# Patient Record
Sex: Female | Born: 2005 | Race: White | Hispanic: Yes | Marital: Single | Smoking: Never smoker
Health system: Southern US, Community
[De-identification: ages and names within clinical notes are randomized; demographics above are authoritative.]

## PROBLEM LIST (undated history)

## (undated) ENCOUNTER — Inpatient Hospital Stay (HOSPITAL_COMMUNITY): Payer: Self-pay

## (undated) DIAGNOSIS — F32A Depression, unspecified: Secondary | ICD-10-CM

## (undated) DIAGNOSIS — Z889 Allergy status to unspecified drugs, medicaments and biological substances status: Secondary | ICD-10-CM

## (undated) DIAGNOSIS — F909 Attention-deficit hyperactivity disorder, unspecified type: Secondary | ICD-10-CM

## (undated) HISTORY — DX: Allergy status to unspecified drugs, medicaments and biological substances: Z88.9

## (undated) HISTORY — DX: Attention-deficit hyperactivity disorder, unspecified type: F90.9

---
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
## 2023-01-17 ENCOUNTER — Encounter: Payer: Self-pay | Admitting: Family

## 2023-01-28 ENCOUNTER — Telehealth (HOSPITAL_COMMUNITY): Payer: 59 | Admitting: Psychiatry

## 2023-01-28 ENCOUNTER — Encounter (HOSPITAL_COMMUNITY): Payer: Self-pay

## 2023-01-28 ENCOUNTER — Other Ambulatory Visit (HOSPITAL_BASED_OUTPATIENT_CLINIC_OR_DEPARTMENT_OTHER): Payer: Self-pay

## 2023-02-06 ENCOUNTER — Other Ambulatory Visit (HOSPITAL_BASED_OUTPATIENT_CLINIC_OR_DEPARTMENT_OTHER): Payer: Self-pay

## 2023-02-06 ENCOUNTER — Other Ambulatory Visit (HOSPITAL_COMMUNITY): Payer: Self-pay | Admitting: Psychiatry

## 2023-02-06 MED ORDER — VENLAFAXINE HCL ER 75 MG PO CP24
75.0000 mg | ORAL_CAPSULE | Freq: Every day | ORAL | 0 refills | Status: DC
  Filled 2023-02-06 – 2023-02-09 (×2): qty 90, 90d supply, fill #0

## 2023-02-09 ENCOUNTER — Other Ambulatory Visit (HOSPITAL_COMMUNITY): Payer: Self-pay

## 2023-02-11 ENCOUNTER — Other Ambulatory Visit (HOSPITAL_BASED_OUTPATIENT_CLINIC_OR_DEPARTMENT_OTHER): Payer: Self-pay

## 2023-02-26 ENCOUNTER — Other Ambulatory Visit (HOSPITAL_BASED_OUTPATIENT_CLINIC_OR_DEPARTMENT_OTHER): Payer: Self-pay

## 2023-02-26 ENCOUNTER — Encounter: Payer: Self-pay | Admitting: Dermatology

## 2023-02-26 ENCOUNTER — Ambulatory Visit (INDEPENDENT_AMBULATORY_CARE_PROVIDER_SITE_OTHER): Payer: 59 | Admitting: Dermatology

## 2023-02-26 DIAGNOSIS — L905 Scar conditions and fibrosis of skin: Secondary | ICD-10-CM

## 2023-02-26 DIAGNOSIS — L700 Acne vulgaris: Secondary | ICD-10-CM | POA: Diagnosis not present

## 2023-02-26 MED ORDER — TRETINOIN 0.025 % EX CREA
1.0000 | TOPICAL_CREAM | CUTANEOUS | 1 refills | Status: DC
  Filled 2023-02-26: qty 45, 30d supply, fill #0
  Filled 2023-03-27 – 2023-04-17 (×2): qty 45, 30d supply, fill #1

## 2023-02-26 MED ORDER — CLINDAMYCIN PHOSPHATE 1 % EX SWAB
1.0000 | Freq: Every day | CUTANEOUS | 1 refills | Status: DC
  Filled 2023-02-26: qty 60, 60d supply, fill #0

## 2023-02-26 MED ORDER — SPIRONOLACTONE 100 MG PO TABS
100.0000 mg | ORAL_TABLET | Freq: Every day | ORAL | 2 refills | Status: DC
  Filled 2023-02-26: qty 30, 30d supply, fill #0
  Filled 2023-03-27 – 2023-04-17 (×2): qty 30, 30d supply, fill #1

## 2023-02-26 NOTE — Patient Instructions (Addendum)
Hello Renee Miller,  Thank you for visiting my office today. Your dedication to enhancing your skin health is greatly appreciated. Below is a summary of the treatment plan we outlined during your consultation:  - Medications Prescribed:   - Spironolactone: For hormonal acne control.   - Tretinoin 0.025% Cream: Apply a pea-sized amount at night on Monday, Wednesday, and Friday. Should you experience dryness or irritation, reduce application to two nights a week.   - Clindamycin Pledgets: Use on affected areas as needed.  - Skincare Routine Adjustments:    - Morning Routine: Begin by washing with [employer] from [employer], followed by applying La Roche-Posay Toleriane Double Repair Moisturizer.    - Night Routine: Wash with the same cleanser, then apply a hyaluronic acid serum, followed by Tretinoin and a heavier cream if necessary. On nights when you're not using Tretinoin, just apply hyaluronic acid and moisturizer.   - Product Discontinuation: Stop using your current salicylic acid products to avoid dryness.  - Additional Recommendations:   - Eye Hydration: Consider  Kiehl's Avocado Eye Cream.   - Morning Cleansing: Ensure to wash your face every morning to remove overnight products, especially before sun exposure.  - Follow-Up:   - Monitoring: Keep an eye on your skin's response to the new regimen. Improvements should be noticeable every four weeks, with significant improvement expected within three months.   - Next Steps: If results are not as anticipated, we may consider Accutane as a subsequent treatment option.  - Educational Materials:   - Samples of the recommended wash and moisturizer have been provided.  Please initiate this new regimen and keep me informed of your progress through MyChart. Should you have any questions or concerns, please do not hesitate to reach out.  Warm regards,  Dr. Langston Reusing [facility] Important Information  Due to recent changes in healthcare  laws, you may see results of your pathology and/or laboratory studies on MyChart before the doctors have had a chance to review them. We understand that in some cases there may be results that are confusing or concerning to you. Please understand that not all results are received at the same time and often the doctors may need to interpret multiple results in order to provide you with the best plan of care or course of treatment. Therefore, we ask that you please give [place] 2 business days to thoroughly review all your results before contacting the office for clarification. Should we see a critical lab result, you will be contacted sooner.   If You Need Anything After Your Visit  If you have any questions or concerns for your doctor, please call our main line at [phone] If no one answers, please leave a voicemail as directed and we will return your call as soon as possible. Messages left after 4 pm will be answered the following business day.   You may also send [place] a message via MyChart. We typically respond to MyChart messages within 1-2 business days.  For prescription refills, please ask your pharmacy to contact our office. Our fax number is [phone].  If you have an urgent issue when the clinic is closed that cannot wait until the next business day, you can page your doctor at the number below.    Please note that while we do our best to be available for urgent issues outside of office hours, we are not available 24/7.   If you have an urgent issue and are unable to reach [place], you may  choose to seek medical care at your doctor's office, retail clinic, urgent care center, or emergency room.  If you have a medical emergency, please immediately call 911 or go to the emergency department. In the event of inclement weather, please call our main line at [phone] for an update on the status of any delays or closures.  Dermatology Medication Tips: Please keep the boxes that topical  medications come in in order to help keep track of the instructions about where and how to use these. Pharmacies typically print the medication instructions only on the boxes and not directly on the medication tubes.   If your medication is too expensive, please contact our office at [phone] or send [place] a message through MyChart.   We are unable to tell what your co-pay for medications will be in advance as this is different depending on your insurance coverage. However, we may be able to find a substitute medication at lower cost or fill out paperwork to get insurance to cover a needed medication.   If a prior authorization is required to get your medication covered by your insurance company, please allow [place] 1-2 business days to complete this process.  Drug prices often vary depending on where the prescription is filled and some pharmacies may offer cheaper prices.  The website www.goodrx.com contains coupons for medications through different pharmacies. The prices here do not account for what the cost may be with help from insurance (it may be cheaper with your insurance), but the website can give you the price if you did not use any insurance.  - You can print the associated coupon and take it with your prescription to the pharmacy.  - You may also stop by our office during regular business hours and pick up a GoodRx coupon card.  - If you need your prescription sent electronically to a different pharmacy, notify our office through [facility] or by phone at [phone]

## 2023-02-26 NOTE — Progress Notes (Unsigned)
   New Patient Visit   Subjective  Renee Miller is a 17 y.o. female who presents for the following: acne  Patient states she has acne located at the face that she would like to have examined. Patient reports the areas have been there for 1 years. She reports the areas are not bothersome.Patient rates irritation 0 out of 10. She states that the areas have not spread. Patient reports she has previously been treated for these areas by PCP and was Rx Minocycline and Clindamycin gel but she did not see a difference after 73mo of use as the Rx's made her skin irritated with a burning sensation. Patient denies Hx of bx. Patient denies family history of skin cancer(s).   The following portions of the chart were reviewed this encounter and updated as appropriate: medications, allergies, medical history  Review of Systems:  No other skin or systemic complaints except as noted in HPI or Assessment and Plan.  Objective  Well appearing patient in no apparent distress; mood and affect are within normal limits.   A focused examination was performed of the following areas: face   Relevant exam findings are noted in the Assessment and Plan.          Assessment & Plan   ACNE VULGARIS Exam: Open comedones and inflammatory papules  flared  Treatment Plan: - D/C OTC The Ordinary & Panoxyl as it is too drying at this time - Rx Spirolactone 100mg  - take once daily - to stop hormone receptors - Recommended La Roche Posay foaming hydrating wash. Samples provided. - Morning Routine - Rx Clindamycin swabs - use every morning - then follow up with La Roche Posay facial double moisturizer.  - Night Routine - Rx Tretinoin 0.025% cream - use nightly on M, W, F. Pt recommended to gague dryness to know to decrease to 2 days a week if needed.   Acne Scars Exam: erythematous, ice pick scars on cheeks I counseled the patient regarding the following: Skin care: Acne scars can be treated with lasers to  decrease the redness and laser re surfacing, dermabrasion, and chemical peels to improve the appearance of scarring. Expectations: Acne scarring are permanent s. cars caused by intense inflammation, rupture and picking of acne lesions. The appearance of acne scarring improves on its own for the first 1-2 years. Contact office if: Acne scars worsen or fail to improve despite months of treatment; patient develops new scars, significantly more nodules or cysts.  Counseling: -Explained to patient that acnes scars can improve with topical retinoids -We will continue to increase strength of topical retinoids over time as tolerated or transition to oral isotretinoin -Once acne is under control we will discuss laser or microneedling as long term treatment options.     Acne vulgaris  Related Medications spironolactone (ALDACTONE) 100 MG tablet Take 1 tablet (100 mg total) by mouth daily.  tretinoin (RETIN-A) 0.025 % cream Apply every Monday, Wednesday, and Friday at bedtime.  clindamycin (CLEOCIN T) 1 % SWAB Apply 1 Application topically daily.    Return in about 3 months (around 05/29/2023) for acne.    Documentation: I have reviewed the above documentation for accuracy and completeness, and I agree with the above.   I, Shirron Marcha Solders, CMA, am acting as scribe for [employer], DO.   Langston Reusing, DO

## 2023-02-27 ENCOUNTER — Encounter: Payer: Self-pay | Admitting: Dermatology

## 2023-02-27 NOTE — Telephone Encounter (Signed)
She should until she's been on her new acne regimen for 2 months before try any new beauty products.

## 2023-03-08 ENCOUNTER — Other Ambulatory Visit (HOSPITAL_BASED_OUTPATIENT_CLINIC_OR_DEPARTMENT_OTHER): Payer: Self-pay

## 2023-03-15 ENCOUNTER — Ambulatory Visit: Payer: 59 | Admitting: Family

## 2023-03-28 ENCOUNTER — Other Ambulatory Visit: Payer: Self-pay

## 2023-03-28 ENCOUNTER — Other Ambulatory Visit (HOSPITAL_BASED_OUTPATIENT_CLINIC_OR_DEPARTMENT_OTHER): Payer: Self-pay

## 2023-04-08 ENCOUNTER — Other Ambulatory Visit (HOSPITAL_BASED_OUTPATIENT_CLINIC_OR_DEPARTMENT_OTHER): Payer: Self-pay

## 2023-04-15 ENCOUNTER — Ambulatory Visit (INDEPENDENT_AMBULATORY_CARE_PROVIDER_SITE_OTHER): Payer: 59 | Admitting: Family

## 2023-04-15 VITALS — BP 122/80 | HR 77 | Temp 97.5°F | Resp 16 | Ht 62.5 in | Wt 114.0 lb

## 2023-04-15 DIAGNOSIS — L709 Acne, unspecified: Secondary | ICD-10-CM | POA: Diagnosis not present

## 2023-04-15 DIAGNOSIS — N643 Galactorrhea not associated with childbirth: Secondary | ICD-10-CM | POA: Diagnosis not present

## 2023-04-15 DIAGNOSIS — R634 Abnormal weight loss: Secondary | ICD-10-CM

## 2023-04-15 DIAGNOSIS — Z30011 Encounter for initial prescription of contraceptive pills: Secondary | ICD-10-CM

## 2023-04-15 HISTORY — DX: Abnormal weight loss: R63.4

## 2023-04-15 LAB — BASIC METABOLIC PANEL
BUN: 11 mg/dL (ref 6–23)
CO2: 26 meq/L (ref 19–32)
Calcium: 10.1 mg/dL (ref 8.4–10.5)
Chloride: 102 meq/L (ref 96–112)
Creatinine, Ser: 0.73 mg/dL (ref 0.40–1.20)
GFR: 119.32 mL/min (ref >60.00)
Glucose, Bld: 81 mg/dL (ref 70–99)
Potassium: 4.2 meq/L (ref 3.5–5.1)
Sodium: 137 meq/L (ref 135–145)

## 2023-04-15 LAB — POCT URINE PREGNANCY: Preg Test, Ur: NEGATIVE

## 2023-04-15 LAB — TSH: TSH: 2.88 u[IU]/mL (ref 0.40–5.00)

## 2023-04-15 NOTE — Patient Instructions (Signed)
 VISIT SUMMARY:  During today's visit, we discussed your recent experience with breast leakage and irritability, which you believe may be due to hormonal changes. We also reviewed your difficulties with birth control adherence and your current acne treatment.  YOUR PLAN:  -GALACTORRHEA: Galactorrhea is a condition where there is unexpected milk production from the breasts. We have ordered a prolactin level, thyroid  testing and kidney function testing to investigate further, and your breast exam showed no abnormal findings.  -BIRTH CONTROL: We understand that you have had difficulty adhering to oral contraceptive pills and experienced adverse reactions to the contraceptive patch. We recommend considering long-acting reversible contraceptives like Nexplanon  or an IUD, and you should discuss these options with your gynecologist at your appointment next week.  -ACNE: Your acne is currently being managed with Aldactone , and we advise you to continue with your current treatment regimen with your dermatologist.  INSTRUCTIONS:  Please follow up with your gynecologist next week to discuss alternative contraceptive options.

## 2023-04-15 NOTE — Assessment & Plan Note (Signed)
  New onset bilateral breast leakage. No current use of medications known to cause galactorrhea. Negative pregnancy test. -Order prolactin level, TSH, renal function -Performed breast exam, no abnormal findings.

## 2023-04-15 NOTE — Assessment & Plan Note (Signed)
 Reports that weight loss has been intentional with healthy diet and more regular exercise.  I advised pt to try to maintain current weight and not to lose additional weight.

## 2023-04-15 NOTE — Addendum Note (Signed)
 Addended by: Wilford Corner on: 04/15/2023 12:28 PM   Modules accepted: Orders

## 2023-04-15 NOTE — Assessment & Plan Note (Signed)
  Difficulty with adherence to oral contraceptive pill. Previous adverse reaction to contraceptive patch. -Recommended considering long-acting reversible contraceptives such as Nexplanon  or IUD. -Advised to discuss options with gynecologist at upcoming appointment next week.

## 2023-04-15 NOTE — Progress Notes (Signed)
 Subjective:     Patient ID: Renee Miller, female    DOB: Sep 08, 2005, 18 y.o.   MRN: [id number]  Chief Complaint  Patient presents with   Breast Problem    Leaking from both breast    HPI  Discussed the use of AI scribe software for clinical note transcription with the patient, who gave verbal consent to proceed.  History of Present Illness   The patient, with a history of breast leakage secondary to risperidone , presents with a recurrence of the symptom. Renee Miller notes that when Renee Miller stopped the risperdone the breast leakage stopped.  Renee Miller most recently noted recurrence of the leakage 'three days ago' when Renee Miller 'pushed down on' her breasts. Renee Miller also reports irritability, which Renee Miller attributes to hormonal changes. Renee Miller denies current use of birth control pills due to difficulty with daily adherence and a previous adverse reaction to the patch, which caused increased appetite and weight gain. Renee Miller is scheduled to see a gynecologist next week to discuss alternative contraceptive options. Renee Miller is also on Aldactone  for acne.      Wt Readings from Last 3 Encounters:  04/15/23 114 lb (51.7 kg) (23%, Z= -0.72)*  11/06/22 116 lb (52.6 kg) (29%, Z= -0.55)*  08/14/22 122 lb (55.3 kg) (43%, Z= -0.17)*   * Growth percentiles are based on CDC (Girls, 2-20 Years) data.       Health Maintenance Due  Topic Date Due   HIV Screening  Never done   Hepatitis C Screening  Never done   INFLUENZA VACCINE  11/08/2022   COVID-19 Vaccine (4 - 2024-25 season) 12/09/2022    Past Medical History:  Diagnosis Date   ADHD    s/p evaluation with Dr. Garnette Altabet 11/21   Depression    H/O seasonal allergies     Past Surgical History:  Procedure Laterality Date   ADENOIDECTOMY, TONSILLECTOMY AND MYRINGOTOMY WITH TUBE PLACEMENT Bilateral 2010   EUSTACHIAN TUBE DILATION Left 2017    Family History  Problem Relation Age of Onset   Hypertension Mother    Healthy Father    Depression Maternal  Grandmother    Drug abuse Maternal Grandmother    Alcohol abuse Maternal Grandmother    Drug abuse Maternal Grandfather    Hypertension Maternal Grandfather    Hyperlipidemia Maternal Grandfather    Heart disease Maternal Grandfather    Alcohol abuse Maternal Grandfather    Diabetes Paternal Grandmother     Social History   Socioeconomic History   Marital status: Single    Spouse name: Not on file   Number of children: Not on file   Years of education: Not on file   Highest education level: 12th grade  Occupational History   Occupation: consulting civil engineer  Tobacco Use   Smoking status: Never   Smokeless tobacco: Never  Vaping Use   Vaping status: Never Used  Substance and Sexual Activity   Alcohol use: Yes   Drug use: Yes   Sexual activity: Never  Other Topics Concern   Not on file  Social History Narrative   Lives with her mom   Enjoys singing and soccer.   Grandparents in in [place]   Has extended family in [place]   Renee Miller is in the 7th grade   Renee Miller attends [employer].   Social Drivers of Corporate Investment Banker Strain: Low Risk  (11/05/2022)   Overall Financial Resource Strain (CARDIA)    Difficulty of Paying Living Expenses: Not hard at all  Food  Insecurity: No Food Insecurity (11/05/2022)   Hunger Vital Sign    Worried About Running Out of Food in the Last Year: Never true    Ran Out of Food in the Last Year: Never true  Transportation Needs: No Transportation Needs (11/05/2022)   PRAPARE - Administrator, Civil Service (Medical): No    Lack of Transportation (Non-Medical): No  Physical Activity: Sufficiently Active (11/05/2022)   Exercise Vital Sign    Days of Exercise per Week: 6 days    Minutes of Exercise per Session: 30 min  Stress: Stress Concern Present (11/05/2022)   [employer] of Occupational Health - Occupational Stress Questionnaire    Feeling of Stress : Rather much  Social Connections: Socially Isolated (11/05/2022)   Social  Connection and Isolation Panel [NHANES]    Frequency of Communication with Friends and Family: Twice a week    Frequency of Social Gatherings with Friends and Family: More than three times a week    Attends Religious Services: Never    Database Administrator or Organizations: No    Attends Engineer, Structural: Not on file    Marital Status: Never married  Intimate Partner Violence: Not At Risk (06/06/2020)   Humiliation, Afraid, Rape, and Kick questionnaire    Fear of Current or Ex-Partner: No    Emotionally Abused: No    Physically Abused: No    Sexually Abused: No    Outpatient Medications Prior to Visit  Medication Sig Dispense Refill   clindamycin  (CLEOCIN  T) 1 % SWAB Apply 1 Application topically daily. 60 each 1   LORazepam  (ATIVAN ) 0.5 MG tablet Take 1 tablet (0.5 mg total) by mouth daily as needed for anxiety. 30 tablet 0   spironolactone  (ALDACTONE ) 100 MG tablet Take 1 tablet (100 mg total) by mouth daily. 30 tablet 2   tretinoin  (RETIN-A ) 0.025 % cream Apply every Monday, Wednesday, and Friday at bedtime. 45 g 1   venlafaxine  XR (EFFEXOR  XR) 75 MG 24 hr capsule Take 1 capsule (75 mg total) by mouth daily with breakfast. 90 capsule 0   norethindrone -ethinyl estradiol -iron (MICROGESTIN  FE 1.5/30) 1.5-30 MG-MCG tablet Take 1 tablet by mouth daily. (Patient not taking: Reported on 04/15/2023) 28 tablet 11   No facility-administered medications prior to visit.    Allergies  Allergen Reactions   Amoxicillin Hives    ROS    See HPI Objective:    Physical Exam Constitutional:      Appearance: Normal appearance.  HENT:     Head: Normocephalic and atraumatic.  Eyes:     General: No scleral icterus. Chest:     Comments: Dense cystic breast tissue bilaterally without discrete masses noted  Pt demonstrated ability to express milky white discharge from right breast Abdominal:     General: Abdomen is flat. There is no distension.  Musculoskeletal:        General:  No swelling.  Skin:    Comments: Some acne noted on bilateral cheeks  Neurological:     Mental Status: Renee Miller is alert and oriented to person, place, and time.  Psychiatric:        Mood and Affect: Mood normal.        Behavior: Behavior normal.        Thought Content: Thought content normal.        Judgment: Judgment normal.      BP 122/80 (BP Location: Left Arm, Patient Position: Sitting, Cuff Size: Small)   Pulse 77  Temp (!) 97.5 F (36.4 C) (Oral)   Resp 16   Ht 5' 2.5 (1.588 m)   Wt 114 lb (51.7 kg)   LMP 03/15/2023   SpO2 98%   BMI 20.52 kg/m  Wt Readings from Last 3 Encounters:  04/15/23 114 lb (51.7 kg) (23%, Z= -0.72)*  11/06/22 116 lb (52.6 kg) (29%, Z= -0.55)*  08/14/22 122 lb (55.3 kg) (43%, Z= -0.17)*   * Growth percentiles are based on CDC (Girls, 2-20 Years) data.       Assessment & Plan:   Problem List Items Addressed This Visit       Unprioritized   Weight loss   Reports that weight loss has been intentional with healthy diet and more regular exercise.  I advised pt to try to maintain current weight and not to lose additional weight.       Galactorrhea - Primary    New onset bilateral breast leakage. No current use of medications known to cause galactorrhea. Negative pregnancy test. -Order prolactin level, TSH, renal function -Performed breast exam, no abnormal findings.       Relevant Orders   Prolactin   TSH   Basic Metabolic Panel (BMET)   Contraceptive management    Difficulty with adherence to oral contraceptive pill. Previous adverse reaction to contraceptive patch. -Recommended considering long-acting reversible contraceptives such as Nexplanon  or IUD. -Advised to discuss options with gynecologist at upcoming appointment next week.      Acne    Currently managed with Aldactone , clindamycin  wipes and tretinoin  cream. -Continue current regimen per dermatology       I have discontinued Hong F. Cartier Abhi's  norethindrone -ethinyl estradiol -iron. I am also having her maintain her LORazepam , venlafaxine  XR, spironolactone , tretinoin , and clindamycin .  No orders of the defined types were placed in this encounter.

## 2023-04-15 NOTE — Assessment & Plan Note (Addendum)
  Currently managed with Aldactone, clindamycin wipes and tretinoin cream. -Continue current regimen per dermatology

## 2023-04-16 LAB — PROLACTIN: Prolactin: 19.9 ng/mL

## 2023-04-17 ENCOUNTER — Other Ambulatory Visit (HOSPITAL_BASED_OUTPATIENT_CLINIC_OR_DEPARTMENT_OTHER): Payer: Self-pay

## 2023-04-17 ENCOUNTER — Other Ambulatory Visit: Payer: Self-pay

## 2023-04-18 ENCOUNTER — Other Ambulatory Visit: Payer: Self-pay

## 2023-04-19 ENCOUNTER — Telehealth: Payer: Self-pay | Admitting: Family

## 2023-04-19 NOTE — Telephone Encounter (Signed)
 See mychart.

## 2023-04-19 NOTE — Telephone Encounter (Signed)
-----   Message from Delon Lenis sent at 04/18/2023  4:55 PM EST ----- Hi Dymon Summerhill,  Thank you for the update on Abhi.  Yes,  I think it's a wise next step to stop the Aldactone  to see if the galactorrhea resolves.  It could very well be the culprit.  There are many other treatment modalities we can use to keep her acne under control.  Thanks!  Best,  Dr. Lenis ----- Message ----- From: Daryl Setter, NP Sent: 04/18/2023  10:28 AM EST To: Delon LOISE Lenis, DO  Dear Dr. Lenis,  I wanted to reach out about our mutual patient Abhi.  In the past she had an episode of elevated prolactin level with galactorrhea that occurred while taking Risperdal .  Risperdal  was discontinued and her galactorrhea resolved.    She presented this week with recurrent galactorrhea, but prolactin level was normal.  I am thinking that aldactone  may be the cause.  Would you be ok with us  holding the aldactone  to see if her galactorrhea resolves?   Thanks,  Lataysha Vohra

## 2023-04-21 ENCOUNTER — Encounter: Payer: Self-pay | Admitting: Family

## 2023-04-21 ENCOUNTER — Encounter (HOSPITAL_COMMUNITY): Payer: Self-pay

## 2023-04-25 DIAGNOSIS — M9902 Segmental and somatic dysfunction of thoracic region: Secondary | ICD-10-CM | POA: Diagnosis not present

## 2023-04-25 DIAGNOSIS — M542 Cervicalgia: Secondary | ICD-10-CM | POA: Diagnosis not present

## 2023-04-25 DIAGNOSIS — M9904 Segmental and somatic dysfunction of sacral region: Secondary | ICD-10-CM | POA: Diagnosis not present

## 2023-04-25 DIAGNOSIS — M9901 Segmental and somatic dysfunction of cervical region: Secondary | ICD-10-CM | POA: Diagnosis not present

## 2023-04-25 DIAGNOSIS — M5451 Vertebrogenic low back pain: Secondary | ICD-10-CM | POA: Diagnosis not present

## 2023-04-25 DIAGNOSIS — M9903 Segmental and somatic dysfunction of lumbar region: Secondary | ICD-10-CM | POA: Diagnosis not present

## 2023-04-25 DIAGNOSIS — M546 Pain in thoracic spine: Secondary | ICD-10-CM | POA: Diagnosis not present

## 2023-04-30 ENCOUNTER — Encounter (HOSPITAL_COMMUNITY): Payer: Self-pay | Admitting: Psychiatry

## 2023-04-30 ENCOUNTER — Telehealth (HOSPITAL_COMMUNITY): Payer: Commercial Managed Care - PPO | Admitting: Psychiatry

## 2023-04-30 ENCOUNTER — Other Ambulatory Visit (HOSPITAL_BASED_OUTPATIENT_CLINIC_OR_DEPARTMENT_OTHER): Payer: Self-pay

## 2023-04-30 DIAGNOSIS — F331 Major depressive disorder, recurrent, moderate: Secondary | ICD-10-CM

## 2023-04-30 DIAGNOSIS — F4310 Post-traumatic stress disorder, unspecified: Secondary | ICD-10-CM

## 2023-04-30 DIAGNOSIS — F410 Panic disorder [episodic paroxysmal anxiety] without agoraphobia: Secondary | ICD-10-CM

## 2023-04-30 DIAGNOSIS — F411 Generalized anxiety disorder: Secondary | ICD-10-CM

## 2023-04-30 MED ORDER — VENLAFAXINE HCL ER 75 MG PO CP24
75.0000 mg | ORAL_CAPSULE | Freq: Every day | ORAL | 0 refills | Status: DC
  Filled 2023-04-30: qty 90, 90d supply, fill #0

## 2023-04-30 MED ORDER — VENLAFAXINE HCL ER 37.5 MG PO CP24
37.5000 mg | ORAL_CAPSULE | Freq: Every day | ORAL | 0 refills | Status: DC
  Filled 2023-04-30: qty 30, 30d supply, fill #0
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
## 2023-05-03 ENCOUNTER — Ambulatory Visit: Payer: Commercial Managed Care - PPO | Admitting: Family

## 2023-05-03 ENCOUNTER — Ambulatory Visit (INDEPENDENT_AMBULATORY_CARE_PROVIDER_SITE_OTHER): Payer: Commercial Managed Care - PPO | Admitting: Family

## 2023-05-03 ENCOUNTER — Other Ambulatory Visit (HOSPITAL_BASED_OUTPATIENT_CLINIC_OR_DEPARTMENT_OTHER): Payer: Self-pay

## 2023-05-03 VITALS — BP 134/85 | HR 104 | Temp 97.7°F | Resp 16 | Ht 62.5 in | Wt 111.0 lb

## 2023-05-03 DIAGNOSIS — Z30019 Encounter for initial prescription of contraceptives, unspecified: Secondary | ICD-10-CM | POA: Diagnosis not present

## 2023-05-03 DIAGNOSIS — Z20828 Contact with and (suspected) exposure to other viral communicable diseases: Secondary | ICD-10-CM

## 2023-05-03 DIAGNOSIS — N643 Galactorrhea not associated with childbirth: Secondary | ICD-10-CM

## 2023-05-03 DIAGNOSIS — F419 Anxiety disorder, unspecified: Secondary | ICD-10-CM | POA: Diagnosis not present

## 2023-05-03 DIAGNOSIS — L709 Acne, unspecified: Secondary | ICD-10-CM

## 2023-05-03 DIAGNOSIS — J3489 Other specified disorders of nose and nasal sinuses: Secondary | ICD-10-CM | POA: Diagnosis not present

## 2023-05-03 LAB — POC INFLUENZA A&B (BINAX/QUICKVUE)
Influenza A, POC: NEGATIVE
Influenza B, POC: NEGATIVE

## 2023-05-03 LAB — POCT URINE PREGNANCY: Preg Test, Ur: NEGATIVE

## 2023-05-03 MED ORDER — HYDROXYZINE PAMOATE 25 MG PO CAPS
25.0000 mg | ORAL_CAPSULE | Freq: Three times a day (TID) | ORAL | 0 refills | Status: DC | PRN
  Filled 2023-05-03: qty 30, 10d supply, fill #0

## 2023-05-03 MED ORDER — NORETHIN ACE-ETH ESTRAD-FE 1.5-30 MG-MCG PO TABS
1.0000 | ORAL_TABLET | Freq: Every day | ORAL | 3 refills | Status: DC
  Filled 2023-05-03: qty 84, 84d supply, fill #0

## 2023-05-03 NOTE — Assessment & Plan Note (Addendum)
Uncontrolled. Effexor recently increased by her psychiatrist. She is also having issues with insomnia. Will add back hydroxyzine prn anxiety and insomnia.

## 2023-05-03 NOTE — Assessment & Plan Note (Signed)
Reports that this has resolved following discontinuation of aldactone.

## 2023-05-03 NOTE — Assessment & Plan Note (Addendum)
Urine HCG negative. Restart OCP.  She wishes to return during her spring break for Nexplanon insertion.

## 2023-05-03 NOTE — Assessment & Plan Note (Signed)
had flu exposure, family is worried that she has the flu. Requested flu swab today which is negative.

## 2023-05-03 NOTE — Assessment & Plan Note (Signed)
Notes that acne has worsened off of hydroxyzine.  However, once she is back on birth control, she states the plan is for her dermatologist to begin Accutane.

## 2023-05-03 NOTE — Patient Instructions (Signed)
VISIT SUMMARY:  During today's visit, we discussed your ongoing issues with anxiety and insomnia, which have worsened since starting Effexor. You also expressed concerns about restarting oral contraceptives and managing your acne. Additionally, we addressed your potential exposure to influenza.  YOUR PLAN:  -ANXIETY AND INSOMNIA: Anxiety and insomnia are conditions that can cause excessive worry and difficulty sleeping. Since starting Effexor, your symptoms have worsened. We will continue with the current Effexor dose as prescribed by your psychiatrist and add Hydroxyzine as needed to help with anxiety and sleep.  -BIRTH CONTROL: You wish to restart oral contraceptives, which are medications used to prevent pregnancy. We will restart Microgestin and send refills. We will also consider Nexplanon placement during your spring break in March.  -ACNE: Acne is a skin condition that occurs when hair follicles become clogged with oil and dead skin cells. You wish to start Accutane once you are back on birth control. For now, continue with your current acne treatment, and we will consider starting Accutane once you are on birth control.  -INFLUENZA EXPOSURE: Influenza, commonly known as the flu, is a viral infection that affects the respiratory system. Due to your potential exposure in your dorm, we will perform an influenza swab to rule out infection.  INSTRUCTIONS:  Please follow up with your psychiatrist regarding the Effexor dosage. Continue taking Hydroxyzine as needed for anxiety and sleep. Restart Microgestin as discussed, and consider Nexplanon placement during spring break. Continue your current acne treatment and we will discuss starting Accutane once you are on birth control. We will perform an influenza swab today to rule out infection.

## 2023-05-03 NOTE — Progress Notes (Signed)
Subjective:     Patient ID: Renee Miller, female    DOB: 2006/03/06, 18 y.o.   MRN: [id number]  Chief Complaint  Patient presents with   Contraception    Here for contraception management    Anxiety    Reports increased anxiety symptoms, on effexor 75    Insomnia    Complains of trouble falling and staying sleep     Discussed the use of AI scribe software for clinical note transcription with the patient, who gave verbal consent to proceed.  History of Present Illness   The patient, with a history of anxiety and insomnia, presents with worsening symptoms since starting Effexor. She reports trouble sleeping, weight loss, decreased appetite, and memory issues. These symptoms have been particularly challenging for her in her academic setting. Despite expressing these concerns to her psychiatrist, the dose of Effexor was increased, which the patient does not believe will be beneficial. She has previously tried multiple medications for her symptoms, including hydroxyzine and buspirone, with varying degrees of success.  In addition to her mental health concerns, the patient is also seeking to restart oral contraceptives. She had previously been on microgestin and spironolactone, the latter of which was suspected to be causing lactation. Since stopping these medications, the patient has noticed worsening skin but improved emotional stability and cessation of lactation. She is considering starting Accutane for her skin once she is back on birth control.      LMP 1/6     Health Maintenance Due  Topic Date Due   HIV Screening  Never done   Hepatitis C Screening  Never done   INFLUENZA VACCINE  11/08/2022   COVID-19 Vaccine (4 - 2024-25 season) 12/09/2022    Past Medical History:  Diagnosis Date   ADHD    s/p evaluation with Dr. Jeannett Senior Altabet 11/21   Depression    H/O seasonal allergies     Past Surgical History:  Procedure Laterality Date   ADENOIDECTOMY, TONSILLECTOMY  AND MYRINGOTOMY WITH TUBE PLACEMENT Bilateral 2010   EUSTACHIAN TUBE DILATION Left 2017    Family History  Problem Relation Age of Onset   Hypertension Mother    Healthy Father    Depression Maternal Grandmother    Drug abuse Maternal Grandmother    Alcohol abuse Maternal Grandmother    Drug abuse Maternal Grandfather    Hypertension Maternal Grandfather    Hyperlipidemia Maternal Grandfather    Heart disease Maternal Grandfather    Alcohol abuse Maternal Grandfather    Diabetes Paternal Grandmother     Social History   Socioeconomic History   Marital status: Single    Spouse name: Not on file   Number of children: Not on file   Years of education: Not on file   Highest education level: 12th grade  Occupational History   Occupation: Consulting civil engineer  Tobacco Use   Smoking status: Never   Smokeless tobacco: Never  Vaping Use   Vaping status: Never Used  Substance and Sexual Activity   Alcohol use: Yes   Drug use: Yes   Sexual activity: Never  Other Topics Concern   Not on file  Social History Narrative   Lives with her mom   Enjoys singing and soccer.   Grandparents in in [place]   Has extended family in [place]   She is in the 7th grade   She attends [employer].   Social Drivers of Health   Financial Resource Strain: Low Risk  (11/05/2022)   Overall  Financial Resource Strain (CARDIA)    Difficulty of Paying Living Expenses: Not hard at all  Food Insecurity: No Food Insecurity (11/05/2022)   Hunger Vital Sign    Worried About Running Out of Food in the Last Year: Never true    Ran Out of Food in the Last Year: Never true  Transportation Needs: No Transportation Needs (11/05/2022)   PRAPARE - Administrator, Civil Service (Medical): No    Lack of Transportation (Non-Medical): No  Physical Activity: Sufficiently Active (11/05/2022)   Exercise Vital Sign    Days of Exercise per Week: 6 days    Minutes of Exercise per Session: 30 min  Stress: Stress  Concern Present (11/05/2022)   [employer] of Occupational Health - Occupational Stress Questionnaire    Feeling of Stress : Rather much  Social Connections: Socially Isolated (11/05/2022)   Social Connection and Isolation Panel [NHANES]    Frequency of Communication with Friends and Family: Twice a week    Frequency of Social Gatherings with Friends and Family: More than three times a week    Attends Religious Services: Never    Database administrator or Organizations: No    Attends Engineer, structural: Not on file    Marital Status: Never married  Intimate Partner Violence: Not At Risk (06/06/2020)   Humiliation, Afraid, Rape, and Kick questionnaire    Fear of Current or Ex-Partner: No    Emotionally Abused: No    Physically Abused: No    Sexually Abused: No    Outpatient Medications Prior to Visit  Medication Sig Dispense Refill   clindamycin (CLEOCIN T) 1 % SWAB Apply 1 Application topically daily. 60 each 1   LORazepam (ATIVAN) 0.5 MG tablet Take 1 tablet (0.5 mg total) by mouth daily as needed for anxiety. 30 tablet 0   spironolactone (ALDACTONE) 100 MG tablet Take 1 tablet (100 mg total) by mouth daily. 30 tablet 2   tretinoin (RETIN-A) 0.025 % cream Apply every Monday, Wednesday, and Friday at bedtime. 45 g 1   venlafaxine XR (EFFEXOR XR) 37.5 MG 24 hr capsule Take 1 capsule (37.5 mg total) by mouth daily with breakfast. Take with 75 mg capsule for a total dose of 112.5 mg daily. 30 capsule 0   venlafaxine XR (EFFEXOR XR) 75 MG 24 hr capsule Take 1 capsule (75 mg total) by mouth daily with breakfast. Take with 37.5 mg capsule for a total dose of 112.5 mg daily. 90 capsule 0   No facility-administered medications prior to visit.    Allergies  Allergen Reactions   Amoxicillin Hives    Review of Systems  Psychiatric/Behavioral:  The patient has insomnia.    See HPI    Objective:    Physical Exam Constitutional:      General: She is not in acute  distress.    Appearance: Normal appearance. She is well-developed.  HENT:     Head: Normocephalic and atraumatic.     Right Ear: Tympanic membrane, ear canal and external ear normal.     Left Ear: Tympanic membrane, ear canal and external ear normal.     Mouth/Throat:     Mouth: Mucous membranes are moist.     Pharynx: No pharyngeal swelling or oropharyngeal exudate.  Eyes:     General: No scleral icterus. Neck:     Thyroid: No thyromegaly.  Cardiovascular:     Rate and Rhythm: Normal rate and regular rhythm.     Heart sounds: Normal  heart sounds. No murmur heard. Pulmonary:     Effort: Pulmonary effort is normal. No respiratory distress.     Breath sounds: Normal breath sounds. No wheezing.  Musculoskeletal:        General: No swelling.     Cervical back: Neck supple.  Skin:    General: Skin is warm and dry.  Neurological:     Mental Status: She is alert and oriented to person, place, and time.  Psychiatric:        Mood and Affect: Mood normal.        Behavior: Behavior normal.        Thought Content: Thought content normal.        Judgment: Judgment normal.      BP 134/85 (BP Location: Left Arm, Patient Position: Sitting, Cuff Size: Small)   Pulse (!) 104   Temp 97.7 F (36.5 C) (Oral)   Resp 16   Ht 5' 2.5" (1.588 m)   Wt 111 lb (50.3 kg)   LMP 03/15/2023   SpO2 100%   BMI 19.98 kg/m  Wt Readings from Last 3 Encounters:  05/03/23 111 lb (50.3 kg) (18%, Z= -0.93)*  04/15/23 114 lb (51.7 kg) (23%, Z= -0.72)*  11/06/22 116 lb (52.6 kg) (29%, Z= -0.55)*   * Growth percentiles are based on CDC (Girls, 2-20 Years) data.       Assessment & Plan:   Problem List Items Addressed This Visit       Unprioritized   Rhinorrhea   had flu exposure, family is worried that she has the flu. Requested flu swab today which is negative.       Galactorrhea   Reports that this has resolved following discontinuation of aldactone.       Contraceptive management - Primary    Urine HCG negative. Restart OCP.  She wishes to return during her spring break for Nexplanon insertion.       Relevant Orders   POCT urine pregnancy (Completed)   Anxiety   Uncontrolled. Effexor recently increased by her psychiatrist. She is also having issues with insomnia. Will add back hydroxyzine prn anxiety and insomnia.       Relevant Medications   hydrOXYzine (VISTARIL) 25 MG capsule   Acne   Notes that acne has worsened off of hydroxyzine.  However, once she is back on birth control, she states the plan is for her dermatologist to begin Accutane.       Relevant Medications   norethindrone-ethinyl estradiol-iron (MICROGESTIN FE 1.5/30) 1.5-30 MG-MCG tablet   Other Visit Diagnoses       Exposure to influenza       Relevant Orders   POC Influenza A&B (Binax test) (Completed)       I am having Alexianna F. Earnhardt "Abhi" start on hydrOXYzine and norethindrone-ethinyl estradiol-iron. I am also having her maintain her LORazepam, spironolactone, tretinoin, clindamycin, venlafaxine XR, and venlafaxine XR.  Meds ordered this encounter  Medications   hydrOXYzine (VISTARIL) 25 MG capsule    Sig: Take 1 capsule (25 mg total) by mouth every 8 (eight) hours as needed (anxiety or insomnia).    Dispense:  30 capsule    Refill:  0    Supervising Provider:   Danise Edge A [[id number]]   norethindrone-ethinyl estradiol-iron (MICROGESTIN FE 1.5/30) 1.5-30 MG-MCG tablet    Sig: Take 1 tablet by mouth daily.    Dispense:  84 tablet    Refill:  3    Supervising Provider:   Danise Edge  A [[id number]]

## 2023-05-06 ENCOUNTER — Other Ambulatory Visit (HOSPITAL_BASED_OUTPATIENT_CLINIC_OR_DEPARTMENT_OTHER): Payer: Self-pay

## 2023-05-06 ENCOUNTER — Other Ambulatory Visit: Payer: Self-pay

## 2023-05-06 ENCOUNTER — Ambulatory Visit
Admission: RE | Admit: 2023-05-06 | Discharge: 2023-05-06 | Disposition: A | Discharge status: Home / Self Care | Payer: Commercial Managed Care - PPO | Source: Ambulatory Visit | Attending: Internal Medicine | Admitting: Internal Medicine

## 2023-05-06 VITALS — BP 131/77 | HR 94 | Temp 98.6°F | Resp 16

## 2023-05-06 DIAGNOSIS — J101 Influenza due to other identified influenza virus with other respiratory manifestations: Secondary | ICD-10-CM | POA: Diagnosis not present

## 2023-05-06 LAB — POC COVID19/FLU A&B COMBO
Covid Antigen, POC: NEGATIVE
Influenza A Antigen, POC: POSITIVE — AB
Influenza B Antigen, POC: NEGATIVE

## 2023-05-06 MED ORDER — OSELTAMIVIR PHOSPHATE 75 MG PO CAPS
75.0000 mg | ORAL_CAPSULE | Freq: Two times a day (BID) | ORAL | 0 refills | Status: DC
  Filled 2023-05-06: qty 10, 5d supply, fill #0

## 2023-05-06 MED ORDER — PROMETHAZINE-DM 6.25-15 MG/5ML PO SYRP
5.0000 mL | ORAL_SOLUTION | Freq: Three times a day (TID) | ORAL | 0 refills | Status: DC | PRN
  Filled 2023-05-06: qty 180, 12d supply, fill #0

## 2023-05-06 NOTE — ED Triage Notes (Signed)
Both suitemates tested positive for flu. On Friday got tested for flu which was negative. Later Friday started to develop cough and fever. Has taken dayquil, nyquil, zycam, ibuprofen, tylenol.

## 2023-05-06 NOTE — Discharge Instructions (Addendum)
Flu A is positive. Flu B and Covid are negative. This is a virus and does not require antibiotics. We can treat with the following:  Tamiflu 75 mg twice daily for 5 days. This medication helps to reduce the symptoms and duration of the flu Promethazine DM 5 mL every 8 hours as needed for cough.  Use caution as this medication can cause drowsiness. Rest and stay hydrated.  Avoid close interaction with others while you are running a fever Return to urgent care or PCP if symptoms worsen or fail to resolve.

## 2023-05-06 NOTE — ED Provider Notes (Signed)
[facility]    CSN: [id number] Arrival date & time: 05/06/23  1451      History   Chief Complaint Chief Complaint  Patient presents with   Fever    Direct exposure to flu last week with roommate at [employer]. Need a note for classes. - Entered by patient    HPI Renee Miller is a 18 y.o. female.   18 year old female who presents to urgent care with complaints of cough, fevers and recent exposure to influenza.  She reports that at college her suite mates were diagnosed with flu.  She began having symptoms on Saturday morning but tested negative at that time for the flu.  Her symptoms have continued to worsen especially the cough and slightly sore throat.  She has had fevers as well.  She denies shortness of breath, chest pain, nausea, vomiting, chills, ear pain.   Fever Associated symptoms: cough and sore throat   Associated symptoms: no chest pain, no chills, no dysuria, no ear pain, no rash and no vomiting     Past Medical History:  Diagnosis Date   ADHD    s/p evaluation with Dr. Jeannett Senior Altabet 11/21   Depression    H/O seasonal allergies     Patient Active Problem List   Diagnosis Date Noted   Rhinorrhea 05/03/2023   Weight loss 04/15/2023   Acne 08/14/2022   Galactorrhea 08/14/2022   Irregular menstrual bleeding 08/16/2021   Contraceptive management 09/07/2020   Abnormal breast tissue 09/07/2020   Severe episode of recurrent major depressive disorder, without psychotic features (HCC) 07/31/2020   MDD (major depressive disorder), recurrent episode, severe (HCC) 07/31/2020   PTSD (post-traumatic stress disorder) 06/06/2020   Panic attacks 06/06/2020   Major depressive disorder, recurrent episode, moderate (HCC) 06/06/2020   Generalized anxiety disorder 06/06/2020   Moderate episode of recurrent major depressive disorder (HCC) 06/06/2020   Anxiety 02/27/2019    Past Surgical History:  Procedure Laterality Date   ADENOIDECTOMY, TONSILLECTOMY AND  MYRINGOTOMY WITH TUBE PLACEMENT Bilateral 2010   EUSTACHIAN TUBE DILATION Left 2017    OB History   No obstetric history on file.      Home Medications    Prior to Admission medications   Medication Sig Start Date End Date Taking? Authorizing Provider  oseltamivir (TAMIFLU) 75 MG capsule Take 1 capsule (75 mg total) by mouth every 12 (twelve) hours. 05/06/23  Yes Jalina Blowers A, PA-C  promethazine-dextromethorphan (PROMETHAZINE-DM) 6.25-15 MG/5ML syrup Take 5 mLs by mouth every 8 (eight) hours as needed for cough. 05/06/23  Yes Efstathios Sawin A, PA-C  clindamycin (CLEOCIN T) 1 % SWAB Apply 1 Application topically daily. 02/26/23   Terri Piedra, DO  hydrOXYzine (VISTARIL) 25 MG capsule Take 1 capsule (25 mg total) by mouth every 8 (eight) hours as needed (anxiety or insomnia). 05/03/23   Sandford Craze, NP  LORazepam (ATIVAN) 0.5 MG tablet Take 1 tablet (0.5 mg total) by mouth daily as needed for anxiety. 11/01/22   Thresa Ross, MD  norethindrone-ethinyl estradiol-iron (MICROGESTIN FE 1.5/30) 1.5-30 MG-MCG tablet Take 1 tablet by mouth daily. 05/03/23   Sandford Craze, NP  spironolactone (ALDACTONE) 100 MG tablet Take 1 tablet (100 mg total) by mouth daily. 02/26/23   Terri Piedra, DO  tretinoin (RETIN-A) 0.025 % cream Apply every Monday, Wednesday, and Friday at bedtime. 02/27/23 02/27/24  Terri Piedra, DO  venlafaxine XR (EFFEXOR XR) 37.5 MG 24 hr capsule Take 1 capsule (37.5 mg total) by mouth daily with  breakfast. Take with 75 mg capsule for a total dose of 112.5 mg daily. 04/30/23   Thresa Ross, MD  venlafaxine XR (EFFEXOR XR) 75 MG 24 hr capsule Take 1 capsule (75 mg total) by mouth daily with breakfast. Take with 37.5 mg capsule for a total dose of 112.5 mg daily. 04/30/23   Thresa Ross, MD    Family History Family History  Problem Relation Age of Onset   Hypertension Mother    Healthy Father    Depression Maternal Grandmother    Drug abuse  Maternal Grandmother    Alcohol abuse Maternal Grandmother    Drug abuse Maternal Grandfather    Hypertension Maternal Grandfather    Hyperlipidemia Maternal Grandfather    Heart disease Maternal Grandfather    Alcohol abuse Maternal Grandfather    Diabetes Paternal Grandmother     Social History Social History   Tobacco Use   Smoking status: Never   Smokeless tobacco: Never  Vaping Use   Vaping status: Never Used  Substance Use Topics   Alcohol use: Yes   Drug use: Yes     Allergies   Amoxicillin   Review of Systems Review of Systems  Constitutional:  Positive for fever. Negative for chills.  HENT:  Positive for sore throat. Negative for ear pain.   Eyes:  Negative for pain and visual disturbance.  Respiratory:  Positive for cough. Negative for shortness of breath.   Cardiovascular:  Negative for chest pain and palpitations.  Gastrointestinal:  Negative for abdominal pain and vomiting.  Genitourinary:  Negative for dysuria and hematuria.  Musculoskeletal:  Negative for arthralgias and back pain.  Skin:  Negative for color change and rash.  Neurological:  Negative for seizures and syncope.  All other systems reviewed and are negative.    Physical Exam Triage Vital Signs ED Triage Vitals  Encounter Vitals Group     BP 05/06/23 1507 131/77     Systolic BP Percentile --      Diastolic BP Percentile --      Pulse Rate 05/06/23 1507 94     Resp 05/06/23 1507 16     Temp 05/06/23 1507 98.6 F (37 C)     Temp src --      SpO2 05/06/23 1507 97 %     Weight --      Height --      Head Circumference --      Peak Flow --      Pain Score 05/06/23 1511 5     Pain Loc --      Pain Education --      Exclude from Growth Chart --    No data found.  Updated Vital Signs BP 131/77   Pulse 94   Temp 98.6 F (37 C)   Resp 16   LMP 04/15/2023 (Approximate)   SpO2 97%   Visual Acuity Right Eye Distance:   Left Eye Distance:   Bilateral Distance:    Right Eye  Near:   Left Eye Near:    Bilateral Near:     Physical Exam Vitals and nursing note reviewed.  Constitutional:      General: She is not in acute distress.    Appearance: She is well-developed.  HENT:     Head: Normocephalic and atraumatic.  Eyes:     Conjunctiva/sclera: Conjunctivae normal.  Cardiovascular:     Rate and Rhythm: Normal rate and regular rhythm.     Heart sounds: No murmur heard. Pulmonary:  Effort: Pulmonary effort is normal. No respiratory distress.     Breath sounds: Normal breath sounds.  Abdominal:     Palpations: Abdomen is soft.     Tenderness: There is no abdominal tenderness.  Musculoskeletal:        General: No swelling.     Cervical back: Neck supple.  Skin:    General: Skin is warm and dry.     Capillary Refill: Capillary refill takes less than 2 seconds.  Neurological:     Mental Status: She is alert.  Psychiatric:        Mood and Affect: Mood normal.      UC Treatments / Results  Labs (all labs ordered are listed, but only abnormal results are displayed) Labs Reviewed  POC COVID19/FLU A&B COMBO - Abnormal; Notable for the following components:      Result Value   Influenza A Antigen, POC Positive (*)    All other components within normal limits    EKG   Radiology No results found.  Procedures Procedures (including critical care time)  Medications Ordered in UC Medications - No data to display  Initial Impression / Assessment and Plan / UC Course  I have reviewed the triage vital signs and the nursing notes.  Pertinent labs & imaging results that were available during my care of the patient were reviewed by me and considered in my medical decision making (see chart for details).     Influenza A   Flu A is positive. Flu B and Covid are negative. This is a virus and does not require antibiotics. We can treat with the following:  Tamiflu 75 mg twice daily for 5 days. This medication helps to reduce the symptoms and  duration of the flu Promethazine DM 5 mL every 8 hours as needed for cough.  Use caution as this medication can cause drowsiness. Rest and stay hydrated.  Avoid close interaction with others while you are running a fever Return to urgent care or PCP if symptoms worsen or fail to resolve.    Final Clinical Impressions(s) / UC Diagnoses   Final diagnoses:  Influenza A     Discharge Instructions      Flu A is positive. Flu B and Covid are negative. This is a virus and does not require antibiotics. We can treat with the following:  Tamiflu 75 mg twice daily for 5 days. This medication helps to reduce the symptoms and duration of the flu Promethazine DM 5 mL every 8 hours as needed for cough.  Use caution as this medication can cause drowsiness. Rest and stay hydrated.  Avoid close interaction with others while you are running a fever Return to urgent care or PCP if symptoms worsen or fail to resolve.     ED Prescriptions     Medication Sig Dispense Auth. Provider   oseltamivir (TAMIFLU) 75 MG capsule Take 1 capsule (75 mg total) by mouth every 12 (twelve) hours. 10 capsule Landis Martins, PA-C   promethazine-dextromethorphan (PROMETHAZINE-DM) 6.25-15 MG/5ML syrup Take 5 mLs by mouth every 8 (eight) hours as needed for cough. 180 mL Landis Martins, [place]      PDMP not reviewed this encounter.   Landis Martins, PA-C 05/06/23 1600

## 2023-05-10 ENCOUNTER — Telehealth (HOSPITAL_COMMUNITY): Payer: 59 | Admitting: Psychiatry

## 2023-05-10 DIAGNOSIS — F4310 Post-traumatic stress disorder, unspecified: Secondary | ICD-10-CM | POA: Diagnosis not present

## 2023-05-14 DIAGNOSIS — F411 Generalized anxiety disorder: Secondary | ICD-10-CM | POA: Diagnosis not present

## 2023-05-28 DIAGNOSIS — F411 Generalized anxiety disorder: Secondary | ICD-10-CM | POA: Diagnosis not present

## 2023-05-29 ENCOUNTER — Ambulatory Visit: Payer: 59 | Admitting: Dermatology

## 2023-06-02 DIAGNOSIS — F410 Panic disorder [episodic paroxysmal anxiety] without agoraphobia: Secondary | ICD-10-CM | POA: Diagnosis not present

## 2023-06-02 DIAGNOSIS — F419 Anxiety disorder, unspecified: Secondary | ICD-10-CM | POA: Diagnosis not present

## 2023-06-02 DIAGNOSIS — Z880 Allergy status to penicillin: Secondary | ICD-10-CM | POA: Diagnosis not present

## 2023-06-03 ENCOUNTER — Telehealth (INDEPENDENT_AMBULATORY_CARE_PROVIDER_SITE_OTHER): Payer: Commercial Managed Care - PPO | Admitting: Psychiatry

## 2023-06-03 ENCOUNTER — Encounter (HOSPITAL_COMMUNITY): Payer: Self-pay | Admitting: Psychiatry

## 2023-06-03 ENCOUNTER — Other Ambulatory Visit (HOSPITAL_BASED_OUTPATIENT_CLINIC_OR_DEPARTMENT_OTHER): Payer: Self-pay

## 2023-06-03 ENCOUNTER — Other Ambulatory Visit: Payer: Self-pay

## 2023-06-03 DIAGNOSIS — F410 Panic disorder [episodic paroxysmal anxiety] without agoraphobia: Secondary | ICD-10-CM | POA: Diagnosis not present

## 2023-06-03 DIAGNOSIS — F5102 Adjustment insomnia: Secondary | ICD-10-CM | POA: Diagnosis not present

## 2023-06-03 DIAGNOSIS — F331 Major depressive disorder, recurrent, moderate: Secondary | ICD-10-CM | POA: Diagnosis not present

## 2023-06-03 DIAGNOSIS — F411 Generalized anxiety disorder: Secondary | ICD-10-CM | POA: Diagnosis not present

## 2023-06-03 MED ORDER — LORAZEPAM 0.5 MG PO TABS
0.5000 mg | ORAL_TABLET | Freq: Every day | ORAL | 0 refills | Status: DC | PRN
  Filled 2023-06-03: qty 30, 30d supply, fill #0

## 2023-06-03 MED ORDER — VENLAFAXINE HCL ER 75 MG PO CP24
75.0000 mg | ORAL_CAPSULE | Freq: Every day | ORAL | 0 refills | Status: DC
  Filled 2023-06-03: qty 90, 90d supply, fill #0

## 2023-06-03 MED ORDER — MIRTAZAPINE 7.5 MG PO TABS
7.5000 mg | ORAL_TABLET | Freq: Every day | ORAL | 0 refills | Status: DC
Start: 2023-06-03 — End: 2023-06-25
  Filled 2023-06-03: qty 30, 30d supply, fill #0
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
## 2023-06-04 ENCOUNTER — Encounter: Payer: Self-pay | Admitting: Family

## 2023-06-05 ENCOUNTER — Ambulatory Visit: Payer: Commercial Managed Care - PPO | Admitting: Family

## 2023-06-05 ENCOUNTER — Ambulatory Visit: Payer: Commercial Managed Care - PPO | Attending: Family

## 2023-06-05 ENCOUNTER — Other Ambulatory Visit (HOSPITAL_BASED_OUTPATIENT_CLINIC_OR_DEPARTMENT_OTHER): Payer: Self-pay

## 2023-06-05 ENCOUNTER — Telehealth: Payer: Self-pay

## 2023-06-05 VITALS — BP 126/77 | HR 106 | Temp 98.6°F | Resp 16 | Ht 62.0 in | Wt 108.0 lb

## 2023-06-05 DIAGNOSIS — R000 Tachycardia, unspecified: Secondary | ICD-10-CM

## 2023-06-05 DIAGNOSIS — F411 Generalized anxiety disorder: Secondary | ICD-10-CM

## 2023-06-05 DIAGNOSIS — R634 Abnormal weight loss: Secondary | ICD-10-CM | POA: Diagnosis not present

## 2023-06-05 DIAGNOSIS — F419 Anxiety disorder, unspecified: Secondary | ICD-10-CM | POA: Diagnosis not present

## 2023-06-05 MED ORDER — HYDROXYZINE PAMOATE 25 MG PO CAPS
25.0000 mg | ORAL_CAPSULE | Freq: Three times a day (TID) | ORAL | 1 refills | Status: DC | PRN
  Filled 2023-06-05: qty 90, 30d supply, fill #0

## 2023-06-05 NOTE — Telephone Encounter (Signed)
 Copied from CRM [id number]. Topic: Clinical - Request for Lab/Test Order >> Jun 05, 2023  8:52 AM Renee Miller wrote: Reason for CRM: Patient has been having frequent panic attacks, chest pain and dizziness and is requesting an EKG test and lab work to be done by 06/07/2023 because she is returning to college on 06/09/2023. Please follow up with patient [phone]

## 2023-06-05 NOTE — Telephone Encounter (Signed)
 Patient has appointment today

## 2023-06-05 NOTE — Progress Notes (Signed)
 Subjective:     Patient ID: Renee Miller, female    DOB: 06/10/2005, 18 y.o.   MRN: [id number]  Chief Complaint  Patient presents with   Anxiety    Here for ed follow up. Having panic attacks.     Anxiety      Discussed the use of AI scribe software for clinical note transcription with the patient, who gave verbal consent to proceed.  History of Present Illness Renee Miller "Renee Miller" is a 18 year old female with anxiety and panic disorder who presents with worsening panic attacks. She is accompanied by her mother. She was referred by Dr. Gilmore Laroche for evaluation of her anxiety and panic attacks.  Since Sunday, June 02, 2023, she has been experiencing severe panic attacks, which led to an ER visit where she was observed but not admitted. Her heart rate was elevated during the visit, but no further treatment was provided. She continues to have frequent panic attacks, feeling 'shaky', 'weak', and emotionally drained, often communicating her symptoms to her mother. She experiences dizziness and chest pain associated with the panic attacks. Her heart rate remains elevated, even with Ativan use, and she feels that monitoring her heart rate on her Apple Watch exacerbates her anxiety.  Her psychiatrist adjusted her medications on Monday, June 03, 2023, by decreasing Effexor to 75 mg and adding Remeron to aid sleep and appetite. Despite these changes, she continues to experience anxiety and panic attacks. Her current medication regimen includes Ativan, taken as needed but limited to one per day, which she feels is insufficient. She also takes hydroxyzine at night, which helps with calming and sleep. She has a history of anxiety and has been on various medications, including Buspar and sertraline, before switching to Effexor. She believes Effexor may contribute to her symptoms, including trouble breathing and chest pain, and wants to discontinue it.  She has experienced significant  weight loss, from 114 lbs in January to 108 lbs currently, attributed to anxiety and panic attacks affecting her appetite and causing vomiting. She is not actively trying to lose weight and hopes Remeron will help stimulate her appetite.  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 She is setting up disability accommodations at school due to her anxiety, which has caused her to fall behind in her studies. She has supportive roommates and plans to move into an apartment with them next year, where she will have her own room and bathroom.  Wt Readings from Last 3 Encounters:  06/05/23 108 lb (49 kg) (13%, Z= -1.15)*  05/03/23 111 lb (50.3 kg) (18%, Z= -0.93)*  04/15/23 114 lb (51.7 kg) (23%, Z= -0.72)*   * Growth percentiles are based on CDC (Girls, 2-20 Years) data.   Lab Results  Component Value Date   TSH 2.88 04/15/2023       Health Maintenance Due  Topic Date Due   HPV VACCINES (3 - Risk 3-dose series) 03/04/2017   HIV Screening  Never done   Hepatitis C Screening  Never done   INFLUENZA VACCINE  11/08/2022   COVID-19 Vaccine (4 - 2024-25 season) 12/09/2022    Past Medical History:  Diagnosis Date   ADHD    s/p evaluation with Dr. Jeannett Senior Altabet 11/21   Depression    H/O seasonal allergies     Past Surgical History:  Procedure Laterality Date   ADENOIDECTOMY, TONSILLECTOMY AND MYRINGOTOMY WITH TUBE PLACEMENT Bilateral 2010  EUSTACHIAN TUBE DILATION Left 2017    Family History  Problem Relation Age of Onset   Hypertension Mother    Healthy Father    Depression Maternal Grandmother    Drug abuse Maternal Grandmother    Alcohol abuse Maternal Grandmother    Drug abuse Maternal Grandfather    Hypertension Maternal Grandfather    Hyperlipidemia Maternal Grandfather    Heart disease Maternal Grandfather    Alcohol abuse Maternal Grandfather    Diabetes Paternal Grandmother     Social History    Socioeconomic History   Marital status: Single    Spouse name: Not on file   Number of children: Not on file   Years of education: Not on file   Highest education level: 12th grade  Occupational History   Occupation: Consulting civil engineer  Tobacco Use   Smoking status: Never   Smokeless tobacco: Never  Vaping Use   Vaping status: Never Used  Substance and Sexual Activity   Alcohol use: Yes   Drug use: Yes   Sexual activity: Never  Other Topics Concern   Not on file  Social History Narrative   Lives with her mom   Enjoys singing and soccer.   Grandparents in in [place]   Has extended family in [place]   She is in the 7th grade   She attends [employer].   Social Drivers of Corporate investment banker Strain: Low Risk  (11/05/2022)   Overall Financial Resource Strain (CARDIA)    Difficulty of Paying Living Expenses: Not hard at all  Food Insecurity: No Food Insecurity (11/05/2022)   Hunger Vital Sign    Worried About Running Out of Food in the Last Year: Never true    Ran Out of Food in the Last Year: Never true  Transportation Needs: No Transportation Needs (11/05/2022)   PRAPARE - Administrator, Civil Service (Medical): No    Lack of Transportation (Non-Medical): No  Physical Activity: Sufficiently Active (11/05/2022)   Exercise Vital Sign    Days of Exercise per Week: 6 days    Minutes of Exercise per Session: 30 min  Stress: Stress Concern Present (11/05/2022)   [employer] of Occupational Health - Occupational Stress Questionnaire    Feeling of Stress : Rather much  Social Connections: Socially Isolated (11/05/2022)   Social Connection and Isolation Panel [NHANES]    Frequency of Communication with Friends and Family: Twice a week    Frequency of Social Gatherings with Friends and Family: More than three times a week    Attends Religious Services: Never    Database administrator or Organizations: No    Attends Engineer, structural: Not on file     Marital Status: Never married  Intimate Partner Violence: Not At Risk (06/06/2020)   Humiliation, Afraid, Rape, and Kick questionnaire    Fear of Current or Ex-Partner: No    Emotionally Abused: No    Physically Abused: No    Sexually Abused: No    Outpatient Medications Prior to Visit  Medication Sig Dispense Refill   clindamycin (CLEOCIN T) 1 % SWAB Apply 1 Application topically daily. 60 each 1   LORazepam (ATIVAN) 0.5 MG tablet Take 1 tablet (0.5 mg total) by mouth daily as needed for anxiety. 30 tablet 0   mirtazapine (REMERON) 7.5 MG tablet Take 1 tablet (7.5 mg total) by mouth at bedtime. 30 tablet 0   norethindrone-ethinyl estradiol-iron (MICROGESTIN FE 1.5/30) 1.5-30 MG-MCG tablet Take 1 tablet  by mouth daily. 84 tablet 3   oseltamivir (TAMIFLU) 75 MG capsule Take 1 capsule (75 mg total) by mouth every 12 (twelve) hours. 10 capsule 0   promethazine-dextromethorphan (PROMETHAZINE-DM) 6.25-15 MG/5ML syrup Take 5 mLs by mouth every 8 (eight) hours as needed for cough. 180 mL 0   spironolactone (ALDACTONE) 100 MG tablet Take 1 tablet (100 mg total) by mouth daily. 30 tablet 2   tretinoin (RETIN-A) 0.025 % cream Apply every Monday, Wednesday, and Friday at bedtime. 45 g 1   venlafaxine XR (EFFEXOR XR) 75 MG 24 hr capsule Take 1 capsule (75 mg total) by mouth daily with breakfast. 90 capsule 0   hydrOXYzine (VISTARIL) 25 MG capsule Take 1 capsule (25 mg total) by mouth every 8 (eight) hours as needed (anxiety or insomnia). 30 capsule 0   No facility-administered medications prior to visit.    Allergies  Allergen Reactions   Amoxicillin Hives    ROS    See HPI Objective:    Physical Exam Constitutional:      Appearance: She is underweight.  HENT:     Head: Normocephalic and atraumatic.  Cardiovascular:     Rate and Rhythm: Tachycardia present.  Musculoskeletal:        General: No swelling.  Neurological:     Mental Status: She is alert.  Psychiatric:        Mood and  Affect: Mood is anxious and depressed. Affect is tearful.      BP 126/77 (BP Location: Right Arm, Patient Position: Sitting, Cuff Size: Small)   Pulse (!) 106   Temp 98.6 F (37 C) (Oral)   Resp 16   Ht 5\' 2"  (1.575 m)   Wt 108 lb (49 kg)   LMP 04/15/2023 (Approximate)   SpO2 100%   BMI 19.75 kg/m  Wt Readings from Last 3 Encounters:  06/05/23 108 lb (49 kg) (13%, Z= -1.15)*  05/03/23 111 lb (50.3 kg) (18%, Z= -0.93)*  04/15/23 114 lb (51.7 kg) (23%, Z= -0.72)*   * Growth percentiles are based on CDC (Girls, 2-20 Years) data.       Assessment & Plan:   Problem List Items Addressed This Visit       Unprioritized   Weight loss   TSH normal in January, Remeron recently added by psychiatry. Hopefully this will help to stimulate her appetite.       Tachycardia - Primary   New.  Likely due to anxiety. EKG tracing is personally reviewed.  EKG notes NSR.  No acute changes.  She notes HR as high as 150's at times. Will order Zio Monitor 7 days.       Relevant Orders   EKG 12-Lead (Completed)   LONG TERM MONITOR (3-14 DAYS)   Generalized anxiety disorder   Relevant Medications   hydrOXYzine (VISTARIL) 25 MG capsule   Anxiety   Uncontrolled.  Severe panic attacks, increased anxiety, and chest pain. Currently on Effexor 75mg , Ativan as needed, and Remeron for sleep and appetite. Patient expresses desire to discontinue Effexor due to perceived side effects and worsening symptoms. -Continue Ativan as needed. -Add Hydroxyzine once daily in the AM and again at night. She is most anxious in the evenings, so I advised her to try taking the ativan in the afternoon.  -Recommend patient reach out to her psychiatrist to see if he would be willing to transition her off of effexor and try a different SSRI such as Lexapro.  -Order Zio monitor to evaluate heart rhythm  during panic episodes.      Relevant Medications   hydrOXYzine (VISTARIL) 25 MG capsule    I am having Renee F.  Miller "Abhi" maintain her spironolactone, tretinoin, clindamycin, norethindrone-ethinyl estradiol-iron, oseltamivir, promethazine-dextromethorphan, mirtazapine, LORazepam, venlafaxine XR, and hydrOXYzine.  Meds ordered this encounter  Medications   hydrOXYzine (VISTARIL) 25 MG capsule    Sig: Take 1 capsule (25 mg total) by mouth every 8 (eight) hours as needed (anxiety or insomnia).    Dispense:  90 capsule    Refill:  1    Supervising Provider:   Danise Edge A [[id number]]

## 2023-06-05 NOTE — Progress Notes (Unsigned)
 EP to read.

## 2023-06-05 NOTE — Patient Instructions (Signed)
 VISIT SUMMARY:  Today, we discussed your ongoing anxiety and panic attacks, which have been severe and frequent. We reviewed your current medications and symptoms, including your recent weight loss and the impact on your daily life. We also talked about potential changes to your treatment plan to help manage your symptoms better.  YOUR PLAN:  -ANXIETY DISORDER: Anxiety disorder involves excessive worry and fear that can interfere with daily activities. We will continue your Ativan as needed, add Hydroxyzine twice daily for anxiety, and recommend your psychiatrist consider switching you from Effexor to Lexapro. We will also order a Zio monitor to check your heart rhythm during panic episodes.  -UNINTENTIONAL WEIGHT LOSS: Your weight loss is likely due to anxiety causing nausea and vomiting. We will increase your Hydroxyzine to twice daily to help stimulate your appetite and encourage you to have nutritional shakes and small, frequent meals.  -GENERAL HEALTH MAINTENANCE: We will check your thyroid function to rule out hyperthyroidism, which can cause anxiety and weight loss. We will follow up in a few weeks to see how you are responding to the medication changes and to check on your overall mental health.  INSTRUCTIONS:  Please follow up with your psychiatrist to discuss transitioning from Effexor to Lexapro. Use the Zio monitor as instructed to evaluate your heart rhythm during panic episodes. Increase your Hydroxyzine to twice daily and try to have nutritional shakes and small, frequent meals. We will check your thyroid function and follow up in a few weeks to assess your progress.

## 2023-06-05 NOTE — Telephone Encounter (Signed)
 Let's put her on for Friday.  Advise her if she has chest pain or shortness of breath in the meantime to go to the ER.

## 2023-06-05 NOTE — Assessment & Plan Note (Signed)
 New.  Likely due to anxiety. EKG tracing is personally reviewed.  EKG notes NSR.  No acute changes.  She notes HR as high as 150's at times. Will order Zio Monitor 7 days.

## 2023-06-05 NOTE — Telephone Encounter (Signed)
 Has appointment today

## 2023-06-05 NOTE — Assessment & Plan Note (Signed)
 TSH normal in January, Remeron recently added by psychiatry. Hopefully this will help to stimulate her appetite.

## 2023-06-05 NOTE — Assessment & Plan Note (Signed)
 Uncontrolled.  Severe panic attacks, increased anxiety, and chest pain. Currently on Effexor 75mg , Ativan as needed, and Remeron for sleep and appetite. Patient expresses desire to discontinue Effexor due to perceived side effects and worsening symptoms. -Continue Ativan as needed. -Add Hydroxyzine once daily in the AM and again at night. She is most anxious in the evenings, so I advised her to try taking the ativan in the afternoon.  -Recommend patient reach out to her psychiatrist to see if he would be willing to transition her off of effexor and try a different SSRI such as Lexapro.  -Order Zio monitor to evaluate heart rhythm during panic episodes.

## 2023-06-06 ENCOUNTER — Encounter (HOSPITAL_COMMUNITY): Payer: Self-pay

## 2023-06-06 ENCOUNTER — Telehealth (HOSPITAL_COMMUNITY): Payer: Self-pay | Admitting: Psychiatry

## 2023-06-06 ENCOUNTER — Encounter (HOSPITAL_COMMUNITY): Payer: Self-pay | Admitting: Psychiatry

## 2023-06-06 ENCOUNTER — Telehealth (HOSPITAL_COMMUNITY): Payer: Self-pay | Admitting: *Deleted

## 2023-06-06 NOTE — Telephone Encounter (Signed)
 REOPENED IN ERROR

## 2023-06-06 NOTE — Telephone Encounter (Signed)
 Patient's mother called with request that provider complete a letter for academic accommodations and supplied guidelines from her college.

## 2023-06-06 NOTE — Telephone Encounter (Signed)
 Rx SEND [facility] - [facility]

## 2023-06-07 ENCOUNTER — Inpatient Hospital Stay: Payer: Commercial Managed Care - PPO | Admitting: Family

## 2023-06-07 ENCOUNTER — Encounter (HOSPITAL_COMMUNITY): Payer: Self-pay | Admitting: *Deleted

## 2023-06-07 ENCOUNTER — Other Ambulatory Visit (HOSPITAL_BASED_OUTPATIENT_CLINIC_OR_DEPARTMENT_OTHER): Payer: Self-pay

## 2023-06-07 DIAGNOSIS — F411 Generalized anxiety disorder: Secondary | ICD-10-CM | POA: Diagnosis not present

## 2023-06-07 MED ORDER — ESCITALOPRAM OXALATE 10 MG PO TABS
10.0000 mg | ORAL_TABLET | Freq: Every day | ORAL | 0 refills | Status: DC
Start: 2023-06-07 — End: 2023-06-25
  Filled 2023-06-07: qty 30, 30d supply, fill #0

## 2023-06-07 NOTE — Addendum Note (Signed)
 Addended by: Thresa Ross on: 06/07/2023 09:28 AM   Modules accepted: Orders

## 2023-06-10 DIAGNOSIS — F411 Generalized anxiety disorder: Secondary | ICD-10-CM | POA: Diagnosis not present

## 2023-06-14 DIAGNOSIS — F411 Generalized anxiety disorder: Secondary | ICD-10-CM | POA: Diagnosis not present

## 2023-06-17 DIAGNOSIS — F411 Generalized anxiety disorder: Secondary | ICD-10-CM | POA: Diagnosis not present

## 2023-06-19 ENCOUNTER — Encounter (HOSPITAL_COMMUNITY): Payer: Self-pay

## 2023-06-23 DIAGNOSIS — F419 Anxiety disorder, unspecified: Secondary | ICD-10-CM | POA: Diagnosis not present

## 2023-06-23 DIAGNOSIS — R000 Tachycardia, unspecified: Secondary | ICD-10-CM | POA: Diagnosis not present

## 2023-06-24 ENCOUNTER — Ambulatory Visit: Payer: 59 | Admitting: Family

## 2023-06-25 ENCOUNTER — Encounter (HOSPITAL_COMMUNITY): Payer: Self-pay | Admitting: Psychiatry

## 2023-06-25 ENCOUNTER — Other Ambulatory Visit (HOSPITAL_BASED_OUTPATIENT_CLINIC_OR_DEPARTMENT_OTHER): Payer: Self-pay

## 2023-06-25 ENCOUNTER — Telehealth (INDEPENDENT_AMBULATORY_CARE_PROVIDER_SITE_OTHER): Payer: Commercial Managed Care - PPO | Admitting: Psychiatry

## 2023-06-25 DIAGNOSIS — F411 Generalized anxiety disorder: Secondary | ICD-10-CM

## 2023-06-25 DIAGNOSIS — F331 Major depressive disorder, recurrent, moderate: Secondary | ICD-10-CM

## 2023-06-25 DIAGNOSIS — F410 Panic disorder [episodic paroxysmal anxiety] without agoraphobia: Secondary | ICD-10-CM | POA: Diagnosis not present

## 2023-06-25 MED ORDER — ESCITALOPRAM OXALATE 10 MG PO TABS
10.0000 mg | ORAL_TABLET | Freq: Every day | ORAL | 0 refills | Status: DC
  Filled 2023-06-25 – 2023-07-01 (×2): qty 30, 30d supply, fill #0

## 2023-06-25 MED ORDER — MIRTAZAPINE 7.5 MG PO TABS
7.5000 mg | ORAL_TABLET | Freq: Every day | ORAL | 0 refills | Status: DC
  Filled 2023-06-25 – 2023-07-01 (×2): qty 30, 30d supply, fill #0
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
## 2023-06-26 ENCOUNTER — Encounter (HOSPITAL_COMMUNITY): Payer: Self-pay

## 2023-06-28 ENCOUNTER — Telehealth (INDEPENDENT_AMBULATORY_CARE_PROVIDER_SITE_OTHER): Admitting: Family

## 2023-06-28 DIAGNOSIS — F419 Anxiety disorder, unspecified: Secondary | ICD-10-CM

## 2023-06-28 DIAGNOSIS — F411 Generalized anxiety disorder: Secondary | ICD-10-CM | POA: Diagnosis not present

## 2023-06-28 NOTE — Assessment & Plan Note (Signed)
 She is doing better on lexapro 5mg  bid along with remeron and hydroxyzine. Following with psychiatry.  Will continue to follow along.

## 2023-06-28 NOTE — Progress Notes (Signed)
 MyChart Video Visit    Virtual Visit via Video Note    Patient location: Home. Patient and provider in visit Provider location: Office  I discussed the limitations of evaluation and management by telemedicine and the availability of in person appointments. The patient expressed understanding and agreed to proceed.  Visit Date: 06/28/2023  Today's healthcare provider: Lemont Fillers, NP     Subjective:    Patient ID: Renee Miller, female    DOB: 04-Jul-2005, 18 y.o.   MRN: [id number]  Chief Complaint  Patient presents with   Anxiety    Follow up    Anxiety      Patient is a 18 yr old female who presents today for follow up of her anxiety.  Since last visit, her psychiatrist has discontinued her effexor and she has started lexapro 5mg  bid.  She notes overall improvement in her mood.  She does still have some issues with anxiety, describing "air hunger" at times.  She tried to go back to college but found it too stressful being away from her support system. She is considering a Insurance risk surveyor while living at home.  She is using hydroxyzine prn with some improvement in her anxiety.  She is trying to avoid lorazepam.   Past Medical History:  Diagnosis Date   ADHD    s/p evaluation with Dr. Jeannett Senior Altabet 11/21   Depression    H/O seasonal allergies     Past Surgical History:  Procedure Laterality Date   ADENOIDECTOMY, TONSILLECTOMY AND MYRINGOTOMY WITH TUBE PLACEMENT Bilateral 2010   EUSTACHIAN TUBE DILATION Left 2017    Family History  Problem Relation Age of Onset   Hypertension Mother    Healthy Father    Depression Maternal Grandmother    Drug abuse Maternal Grandmother    Alcohol abuse Maternal Grandmother    Drug abuse Maternal Grandfather    Hypertension Maternal Grandfather    Hyperlipidemia Maternal Grandfather    Heart disease Maternal Grandfather    Alcohol abuse Maternal Grandfather    Diabetes Paternal Grandmother      Social History   Socioeconomic History   Marital status: Single    Spouse name: Not on file   Number of children: Not on file   Years of education: Not on file   Highest education level: 12th grade  Occupational History   Occupation: Consulting civil engineer  Tobacco Use   Smoking status: Never   Smokeless tobacco: Never  Vaping Use   Vaping status: Never Used  Substance and Sexual Activity   Alcohol use: Yes   Drug use: Yes   Sexual activity: Never  Other Topics Concern   Not on file  Social History Narrative   Lives with her mom   Enjoys singing and soccer.   Grandparents in in [place]   Has extended family in [place]   She is in the 7th grade   She attends [employer].   Social Drivers of Corporate investment banker Strain: Low Risk  (11/05/2022)   Overall Financial Resource Strain (CARDIA)    Difficulty of Paying Living Expenses: Not hard at all  Food Insecurity: No Food Insecurity (11/05/2022)   Hunger Vital Sign    Worried About Running Out of Food in the Last Year: Never true    Ran Out of Food in the Last Year: Never true  Transportation Needs: No Transportation Needs (11/05/2022)   PRAPARE - Administrator, Civil Service (Medical): No  Lack of Transportation (Non-Medical): No  Physical Activity: Sufficiently Active (11/05/2022)   Exercise Vital Sign    Days of Exercise per Week: 6 days    Minutes of Exercise per Session: 30 min  Stress: Stress Concern Present (11/05/2022)   [employer] of Occupational Health - Occupational Stress Questionnaire    Feeling of Stress : Rather much  Social Connections: Socially Isolated (11/05/2022)   Social Connection and Isolation Panel [NHANES]    Frequency of Communication with Friends and Family: Twice a week    Frequency of Social Gatherings with Friends and Family: More than three times a week    Attends Religious Services: Never    Database administrator or Organizations: No    Attends Hospital doctor: Not on file    Marital Status: Never married  Intimate Partner Violence: Not At Risk (06/06/2020)   Humiliation, Afraid, Rape, and Kick questionnaire    Fear of Current or Ex-Partner: No    Emotionally Abused: No    Physically Abused: No    Sexually Abused: No    Outpatient Medications Prior to Visit  Medication Sig Dispense Refill   clindamycin (CLEOCIN T) 1 % SWAB Apply 1 Application topically daily. 60 each 1   escitalopram (LEXAPRO) 10 MG tablet Take 1/2 tablet by mouth daily for 5 days then 1 tablet by mouth daily thereafter. 30 tablet 0   hydrOXYzine (VISTARIL) 25 MG capsule Take 1 capsule (25 mg total) by mouth every 8 (eight) hours as needed (anxiety or insomnia). 90 capsule 1   LORazepam (ATIVAN) 0.5 MG tablet Take 1 tablet (0.5 mg total) by mouth daily as needed for anxiety. 30 tablet 0   mirtazapine (REMERON) 7.5 MG tablet Take 1 tablet (7.5 mg total) by mouth at bedtime. 30 tablet 0   norethindrone-ethinyl estradiol-iron (MICROGESTIN FE 1.5/30) 1.5-30 MG-MCG tablet Take 1 tablet by mouth daily. 84 tablet 3   spironolactone (ALDACTONE) 100 MG tablet Take 1 tablet (100 mg total) by mouth daily. 30 tablet 2   tretinoin (RETIN-A) 0.025 % cream Apply every Monday, Wednesday, and Friday at bedtime. 45 g 1   oseltamivir (TAMIFLU) 75 MG capsule Take 1 capsule (75 mg total) by mouth every 12 (twelve) hours. 10 capsule 0   promethazine-dextromethorphan (PROMETHAZINE-DM) 6.25-15 MG/5ML syrup Take 5 mLs by mouth every 8 (eight) hours as needed for cough. 180 mL 0   No facility-administered medications prior to visit.    Allergies  Allergen Reactions   Amoxicillin Hives    ROS    See HPI  Objective:    Physical Exam Constitutional:      Appearance: Normal appearance.  HENT:     Head: Normocephalic.  Neurological:     Mental Status: She is alert.  Psychiatric:        Attention and Perception: Attention normal.        Mood and Affect: Mood is anxious (mildly  anxious- seems less than last visit).        Speech: Speech normal.        Behavior: Behavior normal.        Thought Content: Thought content normal.        Cognition and Memory: Cognition normal.     There were no vitals taken for this visit. Wt Readings from Last 3 Encounters:  06/05/23 108 lb (49 kg) (13%, Z= -1.15)*  05/03/23 111 lb (50.3 kg) (18%, Z= -0.93)*  04/15/23 114 lb (51.7 kg) (23%, Z= -0.72)*   *  Growth percentiles are based on CDC (Girls, 2-20 Years) data.       Assessment & Plan:   Problem List Items Addressed This Visit       Unprioritized   Anxiety - Primary   She is doing better on lexapro 5mg  bid along with remeron and hydroxyzine. Following with psychiatry.  Will continue to follow along.        I have discontinued Jean F. Turney "Abhi"'s oseltamivir and promethazine-dextromethorphan. I am also having her maintain her spironolactone, tretinoin, clindamycin, norethindrone-ethinyl estradiol-iron, LORazepam, hydrOXYzine, mirtazapine, and escitalopram.  No orders of the defined types were placed in this encounter.   I discussed the assessment and treatment plan with the patient. The patient was provided an opportunity to ask questions and all were answered. The patient agreed with the plan and demonstrated an understanding of the instructions.   The patient was advised to call back or seek an in-person evaluation if the symptoms worsen or if the condition fails to improve as anticipated.  Lemont Fillers, NP [facility] at [facility] [phone] (phone) [phone] (fax)  [facility]

## 2023-06-28 NOTE — Patient Instructions (Signed)
 Please continue current medications and keep your follow up visit with psychiatry.

## 2023-07-01 ENCOUNTER — Other Ambulatory Visit (HOSPITAL_BASED_OUTPATIENT_CLINIC_OR_DEPARTMENT_OTHER): Payer: Self-pay

## 2023-07-05 DIAGNOSIS — F411 Generalized anxiety disorder: Secondary | ICD-10-CM | POA: Diagnosis not present

## 2023-07-12 DIAGNOSIS — F411 Generalized anxiety disorder: Secondary | ICD-10-CM | POA: Diagnosis not present

## 2023-07-17 ENCOUNTER — Encounter: Payer: Self-pay | Admitting: Family

## 2023-07-17 ENCOUNTER — Ambulatory Visit: Admitting: Family

## 2023-07-17 ENCOUNTER — Ambulatory Visit: Attending: Family

## 2023-07-17 VITALS — BP 113/79 | HR 105 | Resp 16 | Wt 104.0 lb

## 2023-07-17 DIAGNOSIS — R002 Palpitations: Secondary | ICD-10-CM

## 2023-07-17 DIAGNOSIS — K219 Gastro-esophageal reflux disease without esophagitis: Secondary | ICD-10-CM | POA: Diagnosis not present

## 2023-07-17 DIAGNOSIS — F419 Anxiety disorder, unspecified: Secondary | ICD-10-CM

## 2023-07-17 DIAGNOSIS — Z0000 Encounter for general adult medical examination without abnormal findings: Secondary | ICD-10-CM

## 2023-07-17 DIAGNOSIS — R0602 Shortness of breath: Secondary | ICD-10-CM

## 2023-07-17 DIAGNOSIS — N643 Galactorrhea not associated with childbirth: Secondary | ICD-10-CM

## 2023-07-17 NOTE — Progress Notes (Unsigned)
 EP to read.

## 2023-07-17 NOTE — Assessment & Plan Note (Signed)
 Improving but still not optimized. Management per psychiatry.

## 2023-07-17 NOTE — Assessment & Plan Note (Addendum)
  Persistent air hunger and palpitations post-flu. Differential includes anxiety, asthma, anemia, thyroid dysfunction, electrolyte imbalance, cardiac issues. Dizziness and fatigue may be related. Pulmonary embolism considered.  - Check for anemia. - Order pulmonary function tests with a lung specialist. - Order echocardiogram. - Order heart monitor for a 7-day EKG. - Order D-dimer test to screen for pulmonary embolism.

## 2023-07-17 NOTE — Patient Instructions (Signed)
 VISIT SUMMARY:  Today, you were seen for symptoms of air hunger and palpitations, which have worsened since you had the flu. We also discussed your acid reflux, constipation, and nipple discharge. You are transitioning from Effexor to Lexapro and are considering medication adjustments for ADHD with your psychiatrist. Renee Miller are planning to return to school in August.  YOUR PLAN:  -AIR HUNGER AND PALPITATIONS: Your symptoms of feeling out of breath and having a racing heart may be due to several factors, including anxiety, asthma, anemia, thyroid issues, electrolyte imbalance, or heart problems. We will recheck your prolactin level, thyroid levels, electrolytes, and screen for anemia. We will also conduct pulmonary function tests, an echocardiogram, a 7-day heart monitor, and a D-dimer test to rule out a pulmonary embolism.  -GASTROESOPHAGEAL REFLUX DISEASE (GERD): GERD is when stomach acid frequently flows back into the tube connecting your mouth and stomach, causing discomfort. To manage this, avoid high acid foods, spicy foods, carbonated beverages, chocolate, mint, alcohol, greasy food, and large meals. Do not eat for an hour and a half before bedtime and continue using over-the-counter acid controllers as needed.  -GALACTORRHEA: Galactorrhea is a condition where you have a milky nipple discharge unrelated to normal milk production. We will recheck your prolactin level to investigate this further.  -CONSTIPATION: Constipation is when you have infrequent or difficult bowel movements. This may be related to your use of Lexapro. We recommend using MiraLAX daily and adjusting the dosage to achieve one bowel movement per day.  -GENERAL HEALTH MAINTENANCE: You are considering returning to school and are being evaluated for ADHD. We discussed the potential addition of Wellbutrin for focus and anxiety. We also recommend a referral to an OBGYN for routine care.  INSTRUCTIONS:  Please schedule a follow-up  appointment in one month. Ensure you receive the heart monitor by mail and follow up with [facility] if you are not contacted within a week.

## 2023-07-17 NOTE — Assessment & Plan Note (Addendum)
 Had EKG last visit for same- NSR. Further evaluation as below.

## 2023-07-17 NOTE — Assessment & Plan Note (Signed)
  Acid reflux with spicy foods, trapped gas, acid regurgitation. Tums and OTC acid controller provide relief. Dietary modifications advised. - Advise dietary modifications to avoid high acid foods, spicy foods, carbonated beverages, chocolate, mint, alcohol, greasy food, and large meals. - Advise not to eat for an hour and a half before bedtime. - Continue over-the-counter acid controller as needed.

## 2023-07-17 NOTE — Assessment & Plan Note (Signed)
  Nipple discharge. Has had in the past on risperdal and aldactone- however she is no longer on either of these medications.  - Recheck prolactin level.

## 2023-07-17 NOTE — Progress Notes (Signed)
 Subjective:     Patient ID: Renee Miller, female    DOB: 01/29/2006, 18 y.o.   MRN: [id number]  Chief Complaint  Patient presents with   Weight Loss    Patient complains of still losing weight.    Breast Problem    Patient reports leaking from both breast.    Shortness of Breath    Patient complains of sob at times    HPI  Discussed the use of AI scribe software for clinical note transcription with the patient, who gave verbal consent to proceed.  History of Present Illness  Renee Miller "Renee Miller" is a 18 year old female who presents with air hunger and palpitations.  She experiences significant air hunger and palpitations, describing her heart racing and feeling out of breath even with minimal exertion. These symptoms have worsened since she had the flu. The air hunger sometimes leads to a cough and frequent throat clearing. She experiences dizziness, particularly when lying on her side, and sometimes feels like she has a gas bubble trapped, which she attempts to relieve with stretches and Tums. No fainting episodes have occurred.  She is transitioning from Effexor to Lexapro and notes that the air hunger and palpitations were not as severe on Effexor. Her current medications include Lexapro, birth control, hydroxyzine as needed, and Ativan occasionally for severe anxiety. Ativan helps distract her from the air hunger but does not alleviate the sensation. She is in the process of being diagnosed with ADHD and is considering medication adjustments with her psychiatrist.  She experiences acid reflux, especially after consuming spicy foods, and takes Tums and an over-the-counter acid controller. She finds it challenging to eat spicy foods due to the reflux. She also reports constipation, which she attributes to Lexapro, and takes a stool softener or MiraLAX as needed.  She mentions nipple discharge when pressing on her breasts, which she previously experienced while on Aldactone  for acne. She is no longer taking Aldactone.  She has been losing weight despite having a good appetite and eating regular meals.  She is currently taking a semester off from school and plans to return in August, likely to a community college.     Health Maintenance Due  Topic Date Due   HIV Screening  Never done   Hepatitis C Screening  Never done   COVID-19 Vaccine (4 - 2024-25 season) 12/09/2022    Past Medical History:  Diagnosis Date   ADHD    s/p evaluation with Dr. Jeannett Senior Miller 11/21   Depression    H/O seasonal allergies     Past Surgical History:  Procedure Laterality Date   ADENOIDECTOMY, TONSILLECTOMY AND MYRINGOTOMY WITH TUBE PLACEMENT Bilateral 2010   EUSTACHIAN TUBE DILATION Left 2017    Family History  Problem Relation Age of Onset   Hypertension Mother    Healthy Father    Depression Maternal Grandmother    Drug abuse Maternal Grandmother    Alcohol abuse Maternal Grandmother    Drug abuse Maternal Grandfather    Hypertension Maternal Grandfather    Hyperlipidemia Maternal Grandfather    Heart disease Maternal Grandfather    Alcohol abuse Maternal Grandfather    Diabetes Paternal Grandmother     Social History   Socioeconomic History   Marital status: Single    Spouse name: Not on file   Number of children: Not on file   Years of education: Not on file   Highest education level: 12th grade  Occupational History  Occupation: Consulting civil engineer  Tobacco Use   Smoking status: Never   Smokeless tobacco: Never  Vaping Use   Vaping status: Never Used  Substance and Sexual Activity   Alcohol use: Yes   Drug use: Yes   Sexual activity: Never  Other Topics Concern   Not on file  Social History Narrative   Lives with her mom   Enjoys singing and soccer.   Grandparents in in [place]   Has extended family in [place]   She is in the 7th grade   She attends [employer].   Social Drivers of Corporate investment banker Strain: Low Risk   (07/17/2023)   Overall Financial Resource Strain (CARDIA)    Difficulty of Paying Living Expenses: Not very hard  Food Insecurity: No Food Insecurity (07/17/2023)   Hunger Vital Sign    Worried About Running Out of Food in the Last Year: Never true    Ran Out of Food in the Last Year: Never true  Transportation Needs: No Transportation Needs (07/17/2023)   PRAPARE - Administrator, Civil Service (Medical): No    Lack of Transportation (Non-Medical): No  Physical Activity: Insufficiently Active (07/17/2023)   Exercise Vital Sign    Days of Exercise per Week: 3 days    Minutes of Exercise per Session: 40 min  Stress: Stress Concern Present (07/17/2023)   [employer] of Occupational Health - Occupational Stress Questionnaire    Feeling of Stress : To some extent  Social Connections: Unknown (07/17/2023)   Social Connection and Isolation Panel [NHANES]    Frequency of Communication with Friends and Family: More than three times a week    Frequency of Social Gatherings with Friends and Family: More than three times a week    Attends Religious Services: Never    Database administrator or Organizations: No    Attends Engineer, structural: Not on file    Marital Status: Patient declined  Intimate Partner Violence: Not At Risk (06/06/2020)   Humiliation, Afraid, Rape, and Kick questionnaire    Fear of Current or Ex-Partner: No    Emotionally Abused: No    Physically Abused: No    Sexually Abused: No    Outpatient Medications Prior to Visit  Medication Sig Dispense Refill   escitalopram (LEXAPRO) 10 MG tablet Take 1/2 tablet by mouth daily for 5 days then 1 tablet by mouth daily thereafter. 30 tablet 0   hydrOXYzine (VISTARIL) 25 MG capsule Take 1 capsule (25 mg total) by mouth every 8 (eight) hours as needed (anxiety or insomnia). 90 capsule 1   LORazepam (ATIVAN) 0.5 MG tablet Take 1 tablet (0.5 mg total) by mouth daily as needed for anxiety. 30 tablet 0   mirtazapine  (REMERON) 7.5 MG tablet Take 1 tablet (7.5 mg total) by mouth at bedtime. 30 tablet 0   norethindrone-ethinyl estradiol-iron (MICROGESTIN FE 1.5/30) 1.5-30 MG-MCG tablet Take 1 tablet by mouth daily. 84 tablet 3   tretinoin (RETIN-A) 0.025 % cream Apply every Monday, Wednesday, and Friday at bedtime. 45 g 1   clindamycin (CLEOCIN T) 1 % SWAB Apply 1 Application topically daily. 60 each 1   spironolactone (ALDACTONE) 100 MG tablet Take 1 tablet (100 mg total) by mouth daily. 30 tablet 2   No facility-administered medications prior to visit.    Allergies  Allergen Reactions   Amoxicillin Hives    ROS See HPI    Objective:    Physical Exam Constitutional:  Appearance: She is well-developed.  Cardiovascular:     Rate and Rhythm: Normal rate and regular rhythm.     Heart sounds: Normal heart sounds. No murmur heard. Pulmonary:     Effort: Pulmonary effort is normal. No respiratory distress.     Breath sounds: Normal breath sounds. No wheezing.  Neurological:     Mental Status: She is alert.  Psychiatric:        Mood and Affect: Mood is anxious (mildly anxious).        Behavior: Behavior normal.        Thought Content: Thought content normal.        Judgment: Judgment normal.      BP 113/79 (BP Location: Left Arm, Patient Position: Sitting, Cuff Size: Normal)   Pulse (!) 105   Resp 16   Wt 104 lb (47.2 kg)   SpO2 98%   BMI 19.02 kg/m  Wt Readings from Last 3 Encounters:  07/17/23 104 lb (47.2 kg) (7%, Z= -1.47)*  06/05/23 108 lb (49 kg) (13%, Z= -1.15)*  05/03/23 111 lb (50.3 kg) (18%, Z= -0.93)*   * Growth percentiles are based on CDC (Girls, 2-20 Years) data.       Assessment & Plan:   Problem List Items Addressed This Visit       Unprioritized   SOB (shortness of breath)    Persistent air hunger and palpitations post-flu. Differential includes anxiety, asthma, anemia, thyroid dysfunction, electrolyte imbalance, cardiac issues. Dizziness and fatigue may  be related. Pulmonary embolism considered.  - Check for anemia. - Order pulmonary function tests with a lung specialist. - Order echocardiogram. - Order heart monitor for a 7-day EKG. - Order D-dimer test to screen for pulmonary embolism.      Relevant Orders   Ambulatory referral to Pulmonology   ECHOCARDIOGRAM COMPLETE   D-Dimer, Quantitative   LONG TERM MONITOR (3-14 DAYS)   Palpitations   Had EKG last visit for same- NSR. Further evaluation as below.      Relevant Orders   TSH   Comp Met (CMET)   CBC w/Diff   LONG TERM MONITOR (3-14 DAYS)   Gastroesophageal reflux disease without esophagitis    Acid reflux with spicy foods, trapped gas, acid regurgitation. Tums and OTC acid controller provide relief. Dietary modifications advised. - Advise dietary modifications to avoid high acid foods, spicy foods, carbonated beverages, chocolate, mint, alcohol, greasy food, and large meals. - Advise not to eat for an hour and a half before bedtime. - Continue over-the-counter acid controller as needed.      Galactorrhea - Primary    Nipple discharge. Has had in the past on risperdal and aldactone- however she is no longer on either of these medications.  - Recheck prolactin level.      Relevant Orders   Prolactin   Anxiety   Improving but still not optimized. Management per psychiatry.       Other Visit Diagnoses       Preventative health care       Relevant Orders   Ambulatory referral to Obstetrics / Gynecology       I have discontinued Pilar F. Beehler "Abhi"'s spironolactone and clindamycin. I am also having her maintain her tretinoin, norethindrone-ethinyl estradiol-iron, LORazepam, hydrOXYzine, mirtazapine, and escitalopram.  No orders of the defined types were placed in this encounter.

## 2023-07-18 LAB — CBC WITH DIFFERENTIAL/PLATELET
Basophils Absolute: 0 10*3/uL (ref 0.0–0.1)
Basophils Relative: 0.7 % (ref 0.0–3.0)
Eosinophils Absolute: 0.1 10*3/uL (ref 0.0–0.7)
Eosinophils Relative: 3.8 % (ref 0.0–5.0)
HCT: 41.5 % (ref 36.0–49.0)
Hemoglobin: 13.9 g/dL (ref 12.0–16.0)
Lymphocytes Relative: 44 % (ref 24.0–48.0)
Lymphs Abs: 1.4 10*3/uL (ref 0.7–4.0)
MCHC: 33.5 g/dL (ref 31.0–37.0)
MCV: 89.3 fl (ref 78.0–98.0)
Monocytes Absolute: 0.2 10*3/uL (ref 0.1–1.0)
Monocytes Relative: 6.3 % (ref 3.0–12.0)
Neutro Abs: 1.5 10*3/uL (ref 1.4–7.7)
Neutrophils Relative %: 45.2 % (ref 43.0–71.0)
Platelets: 225 10*3/uL (ref 150.0–575.0)
RBC: 4.65 Mil/uL (ref 3.80–5.70)
RDW: 14.8 % (ref 11.4–15.5)
WBC: 3.2 10*3/uL — ABNORMAL LOW (ref 4.5–13.5)

## 2023-07-18 LAB — COMPREHENSIVE METABOLIC PANEL WITH GFR
ALT: 10 U/L (ref 0–35)
AST: 19 U/L (ref 0–37)
Albumin: 5.5 g/dL — ABNORMAL HIGH (ref 3.5–5.2)
Alkaline Phosphatase: 79 U/L (ref 47–119)
BUN: 13 mg/dL (ref 6–23)
CO2: 26 meq/L (ref 19–32)
Calcium: 10.3 mg/dL (ref 8.4–10.5)
Chloride: 103 meq/L (ref 96–112)
Creatinine, Ser: 0.75 mg/dL (ref 0.40–1.20)
GFR: 115.3 mL/min (ref >60.00)
Glucose, Bld: 80 mg/dL (ref 70–99)
Potassium: 4.3 meq/L (ref 3.5–5.1)
Sodium: 138 meq/L (ref 135–145)
Total Bilirubin: 0.8 mg/dL (ref 0.2–1.2)
Total Protein: 7.6 g/dL (ref 6.0–8.3)

## 2023-07-18 LAB — PROLACTIN: Prolactin: 11.5 ng/mL

## 2023-07-18 LAB — D-DIMER, QUANTITATIVE: D-Dimer, Quant: <0.19 ug{FEU}/mL (ref <0.50)

## 2023-07-18 LAB — TSH: TSH: 0.92 u[IU]/mL (ref 0.40–5.00)

## 2023-07-19 DIAGNOSIS — F411 Generalized anxiety disorder: Secondary | ICD-10-CM | POA: Diagnosis not present

## 2023-07-26 DIAGNOSIS — F411 Generalized anxiety disorder: Secondary | ICD-10-CM | POA: Diagnosis not present

## 2023-07-29 DIAGNOSIS — F411 Generalized anxiety disorder: Secondary | ICD-10-CM | POA: Diagnosis not present

## 2023-08-01 DIAGNOSIS — F411 Generalized anxiety disorder: Secondary | ICD-10-CM | POA: Diagnosis not present

## 2023-08-07 ENCOUNTER — Telehealth (HOSPITAL_COMMUNITY): Admitting: Psychiatry

## 2023-08-07 ENCOUNTER — Encounter (HOSPITAL_COMMUNITY): Payer: Self-pay

## 2023-08-07 ENCOUNTER — Encounter (HOSPITAL_COMMUNITY): Payer: Self-pay | Admitting: Psychiatry

## 2023-08-10 ENCOUNTER — Other Ambulatory Visit (HOSPITAL_COMMUNITY): Payer: Self-pay | Admitting: Psychiatry

## 2023-08-12 ENCOUNTER — Other Ambulatory Visit (HOSPITAL_BASED_OUTPATIENT_CLINIC_OR_DEPARTMENT_OTHER): Payer: Self-pay

## 2023-08-12 ENCOUNTER — Ambulatory Visit (HOSPITAL_BASED_OUTPATIENT_CLINIC_OR_DEPARTMENT_OTHER): Attending: Family

## 2023-08-12 MED ORDER — ESCITALOPRAM OXALATE 10 MG PO TABS
10.0000 mg | ORAL_TABLET | Freq: Every day | ORAL | 0 refills | Status: DC
  Filled 2023-08-12: qty 30, 30d supply, fill #0

## 2023-08-14 DIAGNOSIS — F411 Generalized anxiety disorder: Secondary | ICD-10-CM | POA: Diagnosis not present

## 2023-09-16 ENCOUNTER — Ambulatory Visit: Admitting: Pulmonary Disease

## 2023-09-16 ENCOUNTER — Encounter: Payer: Self-pay | Admitting: Pulmonary Disease

## 2023-09-17 ENCOUNTER — Other Ambulatory Visit (HOSPITAL_COMMUNITY): Payer: Self-pay | Admitting: Psychiatry

## 2023-09-17 ENCOUNTER — Other Ambulatory Visit (HOSPITAL_BASED_OUTPATIENT_CLINIC_OR_DEPARTMENT_OTHER): Payer: Self-pay

## 2023-09-19 ENCOUNTER — Other Ambulatory Visit (HOSPITAL_COMMUNITY): Payer: Self-pay | Admitting: Psychiatry

## 2023-09-20 ENCOUNTER — Encounter: Payer: Self-pay | Admitting: Family

## 2023-09-20 ENCOUNTER — Other Ambulatory Visit (HOSPITAL_BASED_OUTPATIENT_CLINIC_OR_DEPARTMENT_OTHER): Payer: Self-pay

## 2023-09-20 MED ORDER — ESCITALOPRAM OXALATE 10 MG PO TABS
5.0000 mg | ORAL_TABLET | Freq: Two times a day (BID) | ORAL | 0 refills | Status: DC
  Filled 2023-09-20: qty 30, 30d supply, fill #0

## 2023-09-23 ENCOUNTER — Ambulatory Visit: Admitting: Obstetrics and Gynecology

## 2023-09-23 ENCOUNTER — Other Ambulatory Visit (HOSPITAL_COMMUNITY)
Admission: RE | Admit: 2023-09-23 | Discharge: 2023-09-23 | Disposition: A | Discharge status: Home / Self Care | Source: Ambulatory Visit | Attending: Obstetrics and Gynecology | Admitting: Obstetrics and Gynecology

## 2023-09-23 ENCOUNTER — Encounter: Payer: Self-pay | Admitting: Obstetrics and Gynecology

## 2023-09-23 VITALS — BP 124/72 | HR 75 | Ht 62.0 in | Wt 124.8 lb

## 2023-09-23 DIAGNOSIS — Z309 Encounter for contraceptive management, unspecified: Secondary | ICD-10-CM

## 2023-09-23 DIAGNOSIS — Z3202 Encounter for pregnancy test, result negative: Secondary | ICD-10-CM | POA: Diagnosis not present

## 2023-09-23 DIAGNOSIS — Z30017 Encounter for initial prescription of implantable subdermal contraceptive: Secondary | ICD-10-CM

## 2023-09-23 DIAGNOSIS — F411 Generalized anxiety disorder: Secondary | ICD-10-CM | POA: Diagnosis not present

## 2023-09-23 DIAGNOSIS — Z113 Encounter for screening for infections with a predominantly sexual mode of transmission: Secondary | ICD-10-CM | POA: Diagnosis not present

## 2023-09-23 DIAGNOSIS — Z3009 Encounter for other general counseling and advice on contraception: Secondary | ICD-10-CM

## 2023-09-23 LAB — POCT URINE PREGNANCY: Preg Test, Ur: NEGATIVE

## 2023-09-23 MED ORDER — ETONOGESTREL 68 MG ~~LOC~~ IMPL
68.0000 mg | DRUG_IMPLANT | Freq: Once | SUBCUTANEOUS | Status: AC
  Administered 2023-09-23: 68 mg via SUBCUTANEOUS

## 2023-09-23 NOTE — Progress Notes (Signed)
   GYNECOLOGY OFFICE PROCEDURE NOTE  [name] is a 18 y.o. G0P0000 here for  Nexplanon insertion.  Last pap smear was on n/a.  No other gynecologic concerns. Denies unprotected intercourse within the last 14 days. UPT: negative  Reviewed risks of insertion of implant including risk of infection, bleeding, damage to surrounding tissues and organs, migration of implant, difficult removal. She verbalizes understanding and affirms desire to proceed. Consent signed.   Nexplanon Insertion Procedure Patient identified, informed consent performed, consent signed.   Patient does understand that irregular bleeding is a very common side effect of this medication. She was advised to have backup contraception for one week after placement. Pregnancy test in clinic today was negative.  An adequate timeout was performed.  Patient's left arm was prepped and draped in the usual sterile fashion. The ruler used to measure and mark insertion area.  Patient was prepped with alcohol swab and then injected with 3 ml of 1% lidocaine.  She was prepped with betadine, Nexplanon removed from packaging,  Device confirmed in needle, then inserted full length of needle and withdrawn per handbook instructions. Nexplanon was able to palpated in the patient's arm; patient palpated the insert herself. There was minimal blood loss.  Patient insertion site covered with gauze and a pressure bandage to reduce any bruising.  The patient tolerated the procedure well and was given post procedure instructions.    Larence Pleas, MD, [facility] Attending Center for [facility])

## 2023-09-23 NOTE — Progress Notes (Signed)
 Pt presents to establish care and a different bc. Pt was taking Microgestin  FE 1.5/30, has been off for 2 months. Pt wants to explore her options for bc.

## 2023-09-23 NOTE — Progress Notes (Signed)
   GYNECOLOGY OFFICE NOTE  History:  18 y.o. G0P0000 here today for discussion of contraception options. Currently on microgestin  1.5/30 and it made her moody, leaking milk occasionally, weight gain of 5-10 pounds, worsening breakouts.   Stopped OCPs 2 months ago due to side effects, using condoms in the interim.   Past Medical History:  Diagnosis Date   ADHD    s/p evaluation with Dr. Mara Seminole Altabet 11/21   Depression    H/O seasonal allergies     Past Surgical History:  Procedure Laterality Date   ADENOIDECTOMY, TONSILLECTOMY AND MYRINGOTOMY WITH TUBE PLACEMENT Bilateral 2010   EUSTACHIAN TUBE DILATION Left 2017     Current Outpatient Medications:    escitalopram  (LEXAPRO ) 10 MG tablet, Take 0.5 tablets (5 mg total) by mouth 2 (two) times daily., Disp: 30 tablet, Rfl: 0   hydrOXYzine  (VISTARIL ) 25 MG capsule, Take 1 capsule (25 mg total) by mouth every 8 (eight) hours as needed (anxiety or insomnia)., Disp: 90 capsule, Rfl: 1   LORazepam  (ATIVAN ) 0.5 MG tablet, Take 1 tablet (0.5 mg total) by mouth daily as needed for anxiety., Disp: 30 tablet, Rfl: 0   tretinoin  (RETIN-A ) 0.025 % cream, Apply every Monday, Wednesday, and Friday at bedtime., Disp: 45 g, Rfl: 1  Current Facility-Administered Medications:    etonogestrel (NEXPLANON) implant 68 mg, 68 mg, Subdermal, Once,   The following portions of the patient's history were reviewed and updated as appropriate: allergies, current medications, past family history, past medical history, past social history, past surgical history and problem list.   Review of Systems:  Pertinent items noted in HPI and remainder of comprehensive ROS otherwise negative.   Objective:  Physical Exam BP 124/72   Pulse 75   Ht 5' 2 (1.575 m)   Wt 124 lb 12.8 oz (56.6 kg)   LMP 09/04/2023 (Exact Date)   BMI 22.83 kg/m  CONSTITUTIONAL: Well-developed, well-nourished female in no acute distress.  HENT:  Normocephalic, atraumatic. External right  and left ear normal. Oropharynx is clear and moist EYES: Conjunctivae and EOM are normal. Pupils are equal, round, and reactive to light. No scleral icterus.  NECK: Normal range of motion, supple, no masses SKIN: Skin is warm and dry. No rash noted. Not diaphoretic. No erythema. No pallor. NEUROLOGIC: Alert and oriented to person, place, and time. Normal reflexes, muscle tone coordination. No cranial nerve deficit noted. PSYCHIATRIC: Normal mood and affect. Normal behavior. Normal judgment and thought content. CARDIOVASCULAR: Normal heart rate noted RESPIRATORY: Effort normal, no problems with respiration noted ABDOMEN: Soft, no distention noted.   PELVIC: deferred per patient MUSCULOSKELETAL: Normal range of motion. No edema noted.   Labs and Imaging No results found.  Assessment & Plan:  1. Encounter for contraceptive management, unspecified type (Primary) -Reviewed options for birth control including oral contraceptive pills (combination and progesterone only), NuvaRing, Depo-Provera, Nexplanon, IUDs (copper and levonorgestrol). Thoroughly reviewed risks/benefits/side effects of each. Answered all questions. Patient with h/o of mood swings, weight gain with OCPs and opts for Nexplanon. - see note for placement today - POCT urine pregnancy   Routine preventative health maintenance measures emphasized. Please refer to After Visit Summary for other counseling recommendations.   Return in about 1 year (around 09/22/2024) for annual, patient will call for follow up prn.   K. Andi Kaufmann, MD, [facility] Attending Center for [facility])

## 2023-09-24 ENCOUNTER — Ambulatory Visit: Payer: Self-pay | Admitting: Obstetrics and Gynecology

## 2023-09-24 LAB — RPR+HBSAG+HCVAB+...
HIV Screen 4th Generation wRfx: NONREACTIVE
Hep C Virus Ab: NONREACTIVE
Hepatitis B Surface Ag: NEGATIVE
RPR Ser Ql: NONREACTIVE

## 2023-09-25 LAB — CERVICOVAGINAL ANCILLARY ONLY
Chlamydia: NEGATIVE
Comment: NEGATIVE
Comment: NEGATIVE
Comment: NORMAL
Neisseria Gonorrhea: NEGATIVE
Trichomonas: NEGATIVE

## 2023-10-07 ENCOUNTER — Ambulatory Visit: Admitting: Behavioral Health

## 2023-10-08 DIAGNOSIS — F411 Generalized anxiety disorder: Secondary | ICD-10-CM | POA: Diagnosis not present

## 2023-10-15 DIAGNOSIS — F411 Generalized anxiety disorder: Secondary | ICD-10-CM | POA: Diagnosis not present

## 2023-10-22 ENCOUNTER — Ambulatory Visit: Admitting: Obstetrics and Gynecology

## 2023-10-22 VITALS — BP 115/70 | HR 75 | Wt 133.0 lb

## 2023-10-22 DIAGNOSIS — Z3201 Encounter for pregnancy test, result positive: Secondary | ICD-10-CM

## 2023-10-22 DIAGNOSIS — F902 Attention-deficit hyperactivity disorder, combined type: Secondary | ICD-10-CM | POA: Diagnosis not present

## 2023-10-22 DIAGNOSIS — Z975 Presence of (intrauterine) contraceptive device: Secondary | ICD-10-CM

## 2023-10-22 DIAGNOSIS — F411 Generalized anxiety disorder: Secondary | ICD-10-CM | POA: Diagnosis not present

## 2023-10-22 NOTE — Progress Notes (Unsigned)
   GYNECOLOGY PROGRESS NOTE  History:  18 y.o. G0P0000 presents to [facility] for problem visit. Nexplanon  was placed 6/16. LMP 5/16. Today she reports positive pregnancy test. Used protection for week after nexplanon  placement. She is unsure plans for pregnancy.   Health Maintenance Due  Topic Date Due   Meningococcal B Vaccine (1 of 2 - Standard) Never done   COVID-19 Vaccine (4 - 2024-25 season) 12/09/2022     Review of Systems:  Pertinent items are noted in HPI.   Objective:  Physical Exam Blood pressure 115/70, pulse 75, weight 133 lb (60.3 kg), last menstrual period 09/04/2023. VS reviewed, nursing note reviewed,  Constitutional: well developed, well nourished, no distress HEENT: normocephalic CV: normal rate Pulm/chest wall: normal effort Breast Exam: deferred Abdomen: soft Neuro: alert and oriented  Skin: warm, dry Psych: affect normal Pelvic exam: deferred  Assessment & Plan:    Nidia Daring, FNP

## 2023-10-25 ENCOUNTER — Encounter: Payer: Self-pay | Admitting: Behavioral Health

## 2023-10-25 ENCOUNTER — Other Ambulatory Visit (HOSPITAL_BASED_OUTPATIENT_CLINIC_OR_DEPARTMENT_OTHER): Payer: Self-pay

## 2023-10-25 ENCOUNTER — Ambulatory Visit: Admitting: Family

## 2023-10-25 ENCOUNTER — Ambulatory Visit (INDEPENDENT_AMBULATORY_CARE_PROVIDER_SITE_OTHER): Admitting: Behavioral Health

## 2023-10-25 VITALS — BP 120/84 | HR 72 | Ht 62.0 in | Wt 127.0 lb

## 2023-10-25 DIAGNOSIS — F411 Generalized anxiety disorder: Secondary | ICD-10-CM | POA: Diagnosis not present

## 2023-10-25 DIAGNOSIS — F410 Panic disorder [episodic paroxysmal anxiety] without agoraphobia: Secondary | ICD-10-CM

## 2023-10-25 DIAGNOSIS — F331 Major depressive disorder, recurrent, moderate: Secondary | ICD-10-CM | POA: Diagnosis not present

## 2023-10-25 MED ORDER — ESCITALOPRAM OXALATE 10 MG PO TABS
10.0000 mg | ORAL_TABLET | Freq: Every day | ORAL | 3 refills | Status: DC
  Filled 2023-10-25 – 2023-11-06 (×2): qty 30, 30d supply, fill #0
  Filled 2023-12-19: qty 30, 30d supply, fill #1
  Filled 2024-02-03: qty 30, 30d supply, fill #2

## 2023-10-25 NOTE — Progress Notes (Signed)
 [facility] MD/PA/NP Initial Note  10/25/2023 3:04 PM Renee Miller  MRN:  [id number]  Chief Complaint:  Chief Complaint   Anxiety; Depression; Patient Education; Stress; Medication Refill; Establish Care     HPI:  Renee Miller, 18 year old female presents to this office for initial visit and to establish care.  Collateral information should be considered reliable.  She is pleasant and calm.  Says that she has been struggling with anxiety and depression for several years.  Says that she just wanted to find a new provider to manage her medications.  Says that she is currently taking Lexapro  for anxiety and depression and that so far she is very happy with the medication.  Says that she also recently discovered that she was pregnant approximately 2 weeks ago and knows that it has significant bearing on her medications.  Says that she is still trying to determine whether she wants to continue with the pregnancy.  She is currently at 6 weeks and would like to make a decision by the 10th week.  She is receiving counseling in order to make an informed decision.  She has stopped taking hydroxyzine  and has small amount of Ativan  available for emergencies when she experiences severe anxiety.  She will be following up with OB/GYN next week to discuss further.  States that she would like to continue her Lexapro  for now and understands the risk.  She rates her anxiety today at 4/10, and depression at 4/10.  She is sleeping 7 to 8 hours per night.  Her PHQ 2 was negative.  Her MDQ was negative with only 3 of 14 criterion marked yes.  She does endorse frequent irritability which she relates to increased stress over pregnancy.  Also endorses racing thoughts, and trouble concentrating.  Patient states that she feels safe and verbally contracts for safety with this Clinical research associate.  She is currently living with mother in [facility] .  He denies history of mania, no psychosis, no auditory or visual  hallucinations.  No SI or HI.  Past psychiatric medication trials: Effexor  Sertraline  Hydroxyzine  Ativan  Prozac       Visit Diagnosis:    ICD-10-CM   1. Major depressive disorder, recurrent episode, moderate (HCC)  F33.1 escitalopram  (LEXAPRO ) 10 MG tablet    2. Generalized anxiety disorder  F41.1 escitalopram  (LEXAPRO ) 10 MG tablet    3. Panic attacks  F41.0 escitalopram  (LEXAPRO ) 10 MG tablet      Past Psychiatric History: MDD, Anxiety, PTSD  Past Medical History:  Past Medical History:  Diagnosis Date   ADHD    s/p evaluation with Dr. Garnette Altabet 11/21   Depression    H/O seasonal allergies     Past Surgical History:  Procedure Laterality Date   ADENOIDECTOMY, TONSILLECTOMY AND MYRINGOTOMY WITH TUBE PLACEMENT Bilateral 2010   EUSTACHIAN TUBE DILATION Left 2017    Family Psychiatric History: see chart  Family History:  Family History  Problem Relation Age of Onset   OCD Mother    Hypertension Mother    Healthy Father    Drug abuse Maternal Grandfather    Hypertension Maternal Grandfather    Hyperlipidemia Maternal Grandfather    Heart disease Maternal Grandfather    Alcohol abuse Maternal Grandfather    Bipolar disorder Maternal Grandmother    Depression Maternal Grandmother    Drug abuse Maternal Grandmother    Alcohol abuse Maternal Grandmother    Diabetes Paternal Grandmother     Social History:  Social History   Socioeconomic History  Marital status: Single    Spouse name: Not on file   Number of children: Not on file   Years of education: Not on file   Highest education level: 12th grade  Occupational History   Occupation: student  Tobacco Use   Smoking status: Never   Smokeless tobacco: Never  Vaping Use   Vaping status: Never Used  Substance and Sexual Activity   Alcohol use: Never   Drug use: Never   Sexual activity: Yes    Birth control/protection: None    Comment: Wants bc  Other Topics Concern   Not on file  Social  History Narrative   Lives with her mom. No significant relationship with biological father. Lives in [place].   Now reporting [redacted] weeks pregnant. Trying to make decision on whether she would like to continue pregnancy.   Says she will make decision by 10  weeks.         Grandparents in in [place]   Has extended family in [place]      Social Drivers of Health   Financial Resource Strain: Low Risk  (07/17/2023)   Overall Financial Resource Strain (CARDIA)    Difficulty of Paying Living Expenses: Not very hard  Food Insecurity: No Food Insecurity (07/17/2023)   Hunger Vital Sign    Worried About Running Out of Food in the Last Year: Never true    Ran Out of Food in the Last Year: Never true  Transportation Needs: No Transportation Needs (07/17/2023)   PRAPARE - Administrator, Civil Service (Medical): No    Lack of Transportation (Non-Medical): No  Physical Activity: Insufficiently Active (07/17/2023)   Exercise Vital Sign    Days of Exercise per Week: 3 days    Minutes of Exercise per Session: 40 min  Stress: Stress Concern Present (07/17/2023)   [employer] of Occupational Health - Occupational Stress Questionnaire    Feeling of Stress : To some extent  Social Connections: Unknown (07/17/2023)   Social Connection and Isolation Panel    Frequency of Communication with Friends and Family: More than three times a week    Frequency of Social Gatherings with Friends and Family: More than three times a week    Attends Religious Services: Never    Database administrator or Organizations: No    Attends Engineer, structural: Not on file    Marital Status: Patient declined    Allergies:  Allergies  Allergen Reactions   Amoxicillin Hives    Metabolic Disorder Labs: Lab Results  Component Value Date   HGBA1C 5.8 (H) 07/31/2020   MPG 119.76 07/31/2020   Lab Results  Component Value Date   PROLACTIN 11.5 07/17/2023   PROLACTIN 19.9 04/15/2023   Lab Results   Component Value Date   CHOL 209 (H) 07/31/2020   TRIG 75 07/31/2020   HDL 60 07/31/2020   CHOLHDL 3.5 07/31/2020   VLDL 15 07/31/2020   LDLCALC 134 (H) 07/31/2020   Lab Results  Component Value Date   TSH 0.92 07/17/2023   TSH 2.88 04/15/2023    Therapeutic Level Labs: No results found for: LITHIUM No results found for: VALPROATE No results found for: CBMZ  Current Medications: Current Outpatient Medications  Medication Sig Dispense Refill   escitalopram  (LEXAPRO ) 10 MG tablet Take 1 tablet (10 mg total) by mouth daily. 30 tablet 3   hydrOXYzine  (VISTARIL ) 25 MG capsule Take 1 capsule (25 mg total) by mouth every 8 (eight) hours  as needed (anxiety or insomnia). 90 capsule 1   LORazepam  (ATIVAN ) 0.5 MG tablet Take 1 tablet (0.5 mg total) by mouth daily as needed for anxiety. 30 tablet 0   tretinoin  (RETIN-A ) 0.025 % cream Apply every Monday, Wednesday, and Friday at bedtime. 45 g 1   No current facility-administered medications for this visit.    Medication Side Effects: none  Orders placed this visit:  No orders of the defined types were placed in this encounter.   Psychiatric Specialty Exam:  Review of Systems  Constitutional: Negative.   Gastrointestinal:  Positive for nausea and vomiting.    Blood pressure 120/84, pulse 72, height 5' 2 (1.575 m), weight 127 lb (57.6 kg).Body mass index is 23.23 kg/m.  General Appearance: Casual, Neat, and Well Groomed  Eye Contact:  Good  Speech:  Clear and Coherent  Volume:  Normal  Mood:  Anxious and Depressed  Affect:  Depressed and Anxious  Thought Process:  Coherent  Orientation:  Full (Time, Place, and Person)  Thought Content: Logical   Suicidal Thoughts:  No  Homicidal Thoughts:  No  Memory:  WNL  Judgement:  Good  Insight:  Good  Psychomotor Activity:  Normal  Concentration:  Concentration: Good  Recall:  Good  Fund of Knowledge: Good  Language: Good  Assets:  Desire for Improvement  ADL's:  Intact   Cognition: WNL  Prognosis:  Good   Screenings:  GAD-7    Flowsheet Row Office Visit from 09/01/2019 in [facility] at [facility] Office Visit from 01/30/2019 in [facility] at [facility]  Total GAD-7 Score 15 16   PHQ2-9    Flowsheet Row Office Visit from 10/25/2023 in [facility] Office Visit from 08/14/2022 in [facility] at [facility] Office Visit from 11/16/2021 in [facility] at [facility]  PHQ-2 Total Score 1 0 0  PHQ-9 Total Score -- 0 --   Flowsheet Row UC from 05/06/2023 in [facility] [place] UC from 05/19/2021 in [facility] ED from 05/16/2020 in [facility] Department at [facility]  C-SSRS RISK CATEGORY No Risk No Risk No Risk    Receiving Psychotherapy: No   Treatment Plan/Recommendations:  Greater than 50% of 60 min face to face time with patient was spent on counseling and coordination of care. We discussed her history of anxiety depression stemming back several years.  We discussed her previous plan of care and medications.  We discussed her current pregnancy of 6 weeks.  Had a very thorough conversation about risk of medications during pregnancy.  She acknowledged understanding of this risk.  She is participating in counseling trying to determine whether she wants to continue with the pregnancy.  She says that she will try to make a decision by the 10th week mark.  For now she is very happy with her medication and would like to continue with the Lexapro .  We agreed today to: Will continue Lexapro  10 mg once daily after breakfast Will continue with Ativan  0.5 mg daily as needed for severe anxiety only Patient to stop hydroxyzine  Provided emergency contact information Will follow-up in 4 weeks to reassess Patient will continue to follow-up with OB/GYN regularly  and also discuss medications. Will report worsening symptoms or side effects promptly Discussed potential benefits, risk, and side effects of benzodiazepines to include potential risk of tolerance  and dependence, as well as possible drowsiness.  Advised patient not to drive if experiencing drowsiness and to take lowest possible effective dose to minimize risk of dependence and tolerance.   Redell DELENA Pizza, NP

## 2023-10-29 DIAGNOSIS — F902 Attention-deficit hyperactivity disorder, combined type: Secondary | ICD-10-CM | POA: Diagnosis not present

## 2023-10-29 DIAGNOSIS — F411 Generalized anxiety disorder: Secondary | ICD-10-CM | POA: Diagnosis not present

## 2023-11-04 ENCOUNTER — Other Ambulatory Visit (HOSPITAL_BASED_OUTPATIENT_CLINIC_OR_DEPARTMENT_OTHER): Payer: Self-pay

## 2023-11-06 ENCOUNTER — Other Ambulatory Visit (HOSPITAL_BASED_OUTPATIENT_CLINIC_OR_DEPARTMENT_OTHER): Payer: Self-pay

## 2023-11-07 DIAGNOSIS — F902 Attention-deficit hyperactivity disorder, combined type: Secondary | ICD-10-CM | POA: Diagnosis not present

## 2023-11-07 DIAGNOSIS — F411 Generalized anxiety disorder: Secondary | ICD-10-CM | POA: Diagnosis not present

## 2023-11-11 DIAGNOSIS — F902 Attention-deficit hyperactivity disorder, combined type: Secondary | ICD-10-CM | POA: Diagnosis not present

## 2023-11-11 DIAGNOSIS — F411 Generalized anxiety disorder: Secondary | ICD-10-CM | POA: Diagnosis not present

## 2023-11-21 ENCOUNTER — Ambulatory Visit (INDEPENDENT_AMBULATORY_CARE_PROVIDER_SITE_OTHER): Payer: Self-pay | Admitting: Behavioral Health

## 2023-11-21 DIAGNOSIS — Z91199 Patient's noncompliance with other medical treatment and regimen due to unspecified reason: Secondary | ICD-10-CM

## 2023-11-21 NOTE — Progress Notes (Signed)
 Pt did not show for scheduled visit and did not provide 24 hour notice as required. Additional fees to be assessed.

## 2023-12-04 ENCOUNTER — Other Ambulatory Visit (HOSPITAL_COMMUNITY)
Admission: RE | Admit: 2023-12-04 | Discharge: 2023-12-04 | Disposition: A | Discharge status: Home / Self Care | Source: Ambulatory Visit | Attending: Obstetrics and Gynecology | Admitting: Obstetrics and Gynecology

## 2023-12-04 ENCOUNTER — Other Ambulatory Visit: Payer: Self-pay

## 2023-12-04 ENCOUNTER — Ambulatory Visit (INDEPENDENT_AMBULATORY_CARE_PROVIDER_SITE_OTHER)

## 2023-12-04 VITALS — Wt 147.0 lb

## 2023-12-04 DIAGNOSIS — Z3400 Encounter for supervision of normal first pregnancy, unspecified trimester: Secondary | ICD-10-CM | POA: Insufficient documentation

## 2023-12-04 DIAGNOSIS — Z3402 Encounter for supervision of normal first pregnancy, second trimester: Secondary | ICD-10-CM | POA: Insufficient documentation

## 2023-12-04 NOTE — Progress Notes (Signed)
 New OB Intake  I explained I am completing New OB Intake today. We discussed EDD of 06/09/2024, by Last Menstrual Period. Pt is G1P0000. I reviewed her allergies, medications and Medical/Surgical/OB history.    Patient Active Problem List   Diagnosis Date Noted   Encounter for supervision of normal first pregnancy in second trimester 12/04/2023   Gastroesophageal reflux disease without esophagitis 07/17/2023   SOB (shortness of breath) 07/17/2023   Palpitations 07/17/2023   Tachycardia 06/05/2023   Rhinorrhea 05/03/2023   Weight loss 04/15/2023   Acne 08/14/2022   Galactorrhea 08/14/2022   Irregular menstrual bleeding 08/16/2021   Contraceptive management 09/07/2020   Abnormal breast tissue 09/07/2020   Severe episode of recurrent major depressive disorder, without psychotic features (HCC) 07/31/2020   MDD (major depressive disorder), recurrent episode, severe (HCC) 07/31/2020   PTSD (post-traumatic stress disorder) 06/06/2020   Panic attacks 06/06/2020   Major depressive disorder, recurrent episode, moderate (HCC) 06/06/2020   Generalized anxiety disorder 06/06/2020   Moderate episode of recurrent major depressive disorder (HCC) 06/06/2020   Anxiety 02/27/2019    Concerns addressed today  Patient informed that the ultrasound is considered a limited obstetric ultrasound and is not intended to be a complete ultrasound exam.  Patient also informed that the ultrasound is not being completed with the intent of assessing for fetal or placental anomalies or any pelvic abnormalities. Explained that the purpose of today's ultrasound is to assess for viability.  Patient acknowledges the purpose of the exam and the limitations of the study.     Delivery Plans Plans to deliver at [facility]. Discussed the nature of our practice with multiple providers including residents and students. Due to the size of the practice, the delivering provider may not be the same as those providing prenatal care.    MyChart/Babyscripts MyChart access verified. I explained pt will have some visits in office and some virtually. Babyscripts app discussed and ordered.   Blood Pressure Cuff Blood pressure cuff discussedDiscussed to be used for virtual visits and or if needed BP checks weekly.  Anatomy US  Explained first scheduled US  will be around 19 weeks.   Last Pap No results found for: DIAGPAP  First visit review I reviewed new OB appt with patient. Explained pt will be seen by Nidia Daring NP at first visit. Discussed [place] genetic screening with patient and significant other. Routine prenatal labs ordered.    Erminio DELENA Rumps, [place] 12/04/2023  4:39 PM

## 2023-12-05 ENCOUNTER — Telehealth: Payer: Self-pay

## 2023-12-05 ENCOUNTER — Ambulatory Visit: Payer: Self-pay | Admitting: Obstetrics and Gynecology

## 2023-12-05 DIAGNOSIS — Z6791 Unspecified blood type, Rh negative: Secondary | ICD-10-CM | POA: Insufficient documentation

## 2023-12-05 DIAGNOSIS — Z3402 Encounter for supervision of normal first pregnancy, second trimester: Secondary | ICD-10-CM

## 2023-12-05 LAB — CBC/D/PLT+RPR+RH+ABO+RUBIGG...
Antibody Screen: NEGATIVE
Basophils Absolute: 0 x10E3/uL (ref 0.0–0.2)
Basos: 0 %
EOS (ABSOLUTE): 0.2 x10E3/uL (ref 0.0–0.4)
Eos: 2 %
HCV Ab: NONREACTIVE
HIV Screen 4th Generation wRfx: NONREACTIVE
Hematocrit: 37.7 % (ref 34.0–46.6)
Hemoglobin: 12.4 g/dL (ref 11.1–15.9)
Hepatitis B Surface Ag: NEGATIVE
Immature Grans (Abs): 0 x10E3/uL (ref 0.0–0.1)
Immature Granulocytes: 0 %
Lymphocytes Absolute: 1.9 x10E3/uL (ref 0.7–3.1)
Lymphs: 21 %
MCH: 30.1 pg (ref 26.6–33.0)
MCHC: 32.9 g/dL (ref 31.5–35.7)
MCV: 92 fL (ref 79–97)
Monocytes Absolute: 0.7 x10E3/uL (ref 0.1–0.9)
Monocytes: 8 %
Neutrophils Absolute: 6.2 x10E3/uL (ref 1.4–7.0)
Neutrophils: 69 %
Platelets: 216 x10E3/uL (ref 150–450)
RBC: 4.12 x10E6/uL (ref 3.77–5.28)
RDW: 14.6 % (ref 11.7–15.4)
RPR Ser Ql: NONREACTIVE
Rh Factor: NEGATIVE
Rubella Antibodies, IGG: 1.21 {index} (ref >0.99)
WBC: 9 x10E3/uL (ref 3.4–10.8)

## 2023-12-05 LAB — HCV INTERPRETATION

## 2023-12-05 NOTE — Telephone Encounter (Signed)
 Called pt to discuss her labs she is Rh- and will need Rhogam at 28 wks

## 2023-12-06 LAB — CERVICOVAGINAL ANCILLARY ONLY
Chlamydia: NEGATIVE
Comment: NEGATIVE
Comment: NORMAL
Neisseria Gonorrhea: NEGATIVE

## 2023-12-08 LAB — URINE CULTURE, OB REFLEX

## 2023-12-08 LAB — CULTURE, OB URINE

## 2023-12-10 ENCOUNTER — Ambulatory Visit (HOSPITAL_BASED_OUTPATIENT_CLINIC_OR_DEPARTMENT_OTHER): Admission: RE | Admit: 2023-12-10 | Source: Ambulatory Visit

## 2023-12-11 ENCOUNTER — Ambulatory Visit (INDEPENDENT_AMBULATORY_CARE_PROVIDER_SITE_OTHER): Admitting: Obstetrics and Gynecology

## 2023-12-11 VITALS — BP 124/73 | HR 84 | Wt 148.0 lb

## 2023-12-11 DIAGNOSIS — Z3046 Encounter for surveillance of implantable subdermal contraceptive: Secondary | ICD-10-CM

## 2023-12-11 NOTE — Progress Notes (Signed)
     GYNECOLOGY OFFICE PROCEDURE NOTE  Renee Miller is a 18 y.o. G1P0000 here for Nexplanon  removal.    Last pap smear: No Cervical Cancer Screening results to display.   Nexplanon  removal Procedure Patient identified, informed consent performed, consent signed.   Appropriate time out taken. Nexplanon  site identified in patient's left arm.  Area prepped in usual sterile fashon. One ml of 1% lidocaine was used to anesthetize the area at the distal end of the implant. A small stab incision was made right beside the implant on the distal portion.  The Nexplanon  rod was grasped using hemostats and removed without difficulty. There was minimal blood loss. There were no complications. Steri-strips were applied over the small incision. A pressure bandage was applied to reduce any bruising. The patient tolerated the procedure well and was given post procedure instructions. Patient is pregnant and has plans to continue the pregnancy.  [name], MD, FACOG Obstetrician & Gynecologist, [facility] for [facility], [facility]

## 2023-12-14 ENCOUNTER — Ambulatory Visit (HOSPITAL_BASED_OUTPATIENT_CLINIC_OR_DEPARTMENT_OTHER)
Admission: RE | Admit: 2023-12-14 | Discharge: 2023-12-14 | Disposition: A | Discharge status: Home / Self Care | Source: Ambulatory Visit | Attending: Obstetrics and Gynecology | Admitting: Obstetrics and Gynecology

## 2023-12-14 ENCOUNTER — Other Ambulatory Visit: Payer: Self-pay | Admitting: Obstetrics and Gynecology

## 2023-12-14 DIAGNOSIS — Z3687 Encounter for antenatal screening for uncertain dates: Secondary | ICD-10-CM | POA: Diagnosis not present

## 2023-12-14 DIAGNOSIS — Z3402 Encounter for supervision of normal first pregnancy, second trimester: Secondary | ICD-10-CM | POA: Insufficient documentation

## 2023-12-14 DIAGNOSIS — O208 Other hemorrhage in early pregnancy: Secondary | ICD-10-CM | POA: Diagnosis not present

## 2023-12-14 DIAGNOSIS — Z3A14 14 weeks gestation of pregnancy: Secondary | ICD-10-CM | POA: Insufficient documentation

## 2023-12-17 ENCOUNTER — Ambulatory Visit (INDEPENDENT_AMBULATORY_CARE_PROVIDER_SITE_OTHER): Admitting: Obstetrics and Gynecology

## 2023-12-17 VITALS — BP 131/79 | HR 85 | Wt 151.0 lb

## 2023-12-17 DIAGNOSIS — Z3A15 15 weeks gestation of pregnancy: Secondary | ICD-10-CM | POA: Diagnosis not present

## 2023-12-17 DIAGNOSIS — Z3402 Encounter for supervision of normal first pregnancy, second trimester: Secondary | ICD-10-CM | POA: Diagnosis not present

## 2023-12-17 NOTE — Progress Notes (Unsigned)
 INITIAL PRENATAL VISIT  Subjective:   Renee Miller is being seen today for her first obstetrical visit.  This {is/is not:9024} a planned pregnancy. This {is/is not:9024} a desired pregnancy.  She is at [redacted]w[redacted]d gestation by *** Her obstetrical history is significant for {ob risk factors:10154}. Relationship with FOB: {fob:16621}. Patient {does/does not:19097} intend to breast feed. Pregnancy history fully reviewed.  Patient reports {sx:14538}.  Indications for ASA therapy (per uptodate) Two or more of the following: Nulliparity Yes Obesity (body mass index >30 kg/m2) No Family history of preeclampsia in mother or sister {yes/no:20286} Age >=35 years {yes/no:20286} Sociodemographic characteristics (African American race, low socioeconomic level) {yes/no:20286} Personal risk factors (eg, previous pregnancy with low birth weight or small for gestational age infant, previous adverse pregnancy outcome [eg, stillbirth], interval >10 years between pregnancies) {yes/no:20286}  Indications for early GDM screening  First-degree relative with diabetes {yes/no:20286} BMI >30kg/m2 {yes/no:20286} Age > 25 {yes/no:20286} Previous birth of an infant weighing >=4000 g {yes/no:20286} Gestational diabetes mellitus in a previous pregnancy {yes/no:20286} Glycated hemoglobin >=5.7 percent (39 mmol/mol), impaired glucose tolerance, or impaired fasting glucose on previous testing {yes/no:20286} High-risk race/ethnicity (eg, African American, Latino, Native American, [place] American, Pacific Islander) {yes/no:20286} Previous stillbirth of unknown cause {yes/no:20286} Maternal birthweight > 9 lbs {yes/no:20286} History of cardiovascular disease {yes/no:20286} Hypertension or on therapy for hypertension {yes/no:20286} High-density lipoprotein cholesterol level <35 mg/dL (9.09 mmol/L) and/or a triglyceride level >250 mg/dL (7.17 mmol/L) {bzd/wn:79713} Polycystic ovary syndrome {yes/no:20286} Physical  inactivity {yes/no:20286} Other clinical condition associated with insulin resistance (eg, severe obesity, acanthosis nigricans) {yes/no:20286} Current use of glucocorticoids {yes/no:20286}   Early screening tests: FBS, A1C, Random CBG, glucose challenge   Objective:    Obstetric History OB History  Gravida Para Term Preterm AB Living  1 0 0 0 0 0  SAB IAB Ectopic Multiple Live Births  0 0 0 0 0    # Outcome Date GA Lbr Len/2nd Weight Sex Type Anes PTL Lv  1 Current             Past Medical History:  Diagnosis Date  . ADHD    s/p evaluation with Dr. Garnette Engman 11/21  . Depression   . H/O seasonal allergies   . Irregular menstrual bleeding 08/16/2021    Past Surgical History:  Procedure Laterality Date  . ADENOIDECTOMY, TONSILLECTOMY AND MYRINGOTOMY WITH TUBE PLACEMENT Bilateral 2010  . EUSTACHIAN TUBE DILATION Left 2017    Current Outpatient Medications on File Prior to Visit  Medication Sig Dispense Refill  . escitalopram  (LEXAPRO ) 10 MG tablet Take 1 tablet (10 mg total) by mouth daily. 30 tablet 3  . hydrOXYzine  (VISTARIL ) 25 MG capsule Take 1 capsule (25 mg total) by mouth every 8 (eight) hours as needed (anxiety or insomnia). (Patient not taking: Reported on 12/11/2023) 90 capsule 1  . LORazepam  (ATIVAN ) 0.5 MG tablet Take 1 tablet (0.5 mg total) by mouth daily as needed for anxiety. (Patient not taking: Reported on 12/11/2023) 30 tablet 0  . Prenatal Vit-Fe Fumarate-FA (MULTIVITAMIN-PRENATAL) 27-0.8 MG TABS tablet Take 1 tablet by mouth daily at 12 noon.    . tretinoin  (RETIN-A ) 0.025 % cream Apply every Monday, Wednesday, and Friday at bedtime. (Patient not taking: Reported on 12/11/2023) 45 g 1   No current facility-administered medications on file prior to visit.    Allergies  Allergen Reactions  . Amoxicillin Hives    Social History:  reports that she has never smoked. She has never used smokeless tobacco. She reports that  she does not currently use  alcohol. She reports that she does not use drugs.  Family History  Problem Relation Age of Onset  . OCD Mother   . Hypertension Mother   . Healthy Father   . Drug abuse Maternal Grandfather   . Hypertension Maternal Grandfather   . Hyperlipidemia Maternal Grandfather   . Heart disease Maternal Grandfather   . Alcohol abuse Maternal Grandfather   . Bipolar disorder Maternal Grandmother   . Depression Maternal Grandmother   . Drug abuse Maternal Grandmother   . Alcohol abuse Maternal Grandmother   . Diabetes Paternal Grandmother     The following portions of the patient's history were reviewed and updated as appropriate: allergies, current medications, past family history, past medical history, past social history, past surgical history and problem list.  Review of Systems Review of Systems    Physical Exam:  BP 131/79   Pulse 85   Wt 151 lb (68.5 kg)   LMP 09/03/2023   BMI 27.62 kg/m  CONSTITUTIONAL: Well-developed, well-nourished female in no acute distress.  HENT:  Normocephalic, atraumatic.   EYES: Conjunctivae normal. NECK: Normal range of motion SKIN: Skin is warm and dry. MUSCULOSKELETAL: Normal range of motion NEUROLOGIC: Alert and oriented  PSYCHIATRIC: Normal mood and affect. Normal behavior.  CARDIOVASCULAR: Normal heart rate noted RESPIRATORY: normal effort ABDOMEN: Soft PELVIC:deferred  Fetal Heart Rate (bpm): 154   Movement: Absent       Assessment:    Pregnancy: G1P0000  There are no diagnoses linked to this encounter.    Plan:     Initial labs drawn. Prenatal vitamins. Problem list reviewed and updated. Reviewed in detail the nature of the practice with collaborative care between  Genetic screening discussed: NIPS/First trimester screen/Quad/AFP {requests/ordered/declines:14581}. Role of ultrasound in pregnancy discussed; Anatomy US : {requests/ordered/declines:14581}. Follow up in {numbers 0-4:31231} weeks. Weight gain recommendations per  IOM guidelines reviewed: underweight/BMI 18.5 or less > 28 - 40 lbs; normal weight/BMI 18.5 - 24.9 > 25 - 35 lbs; overweight/BMI 25 - 29.9 > 15 - 25 lbs; obese/BMI  30 or more > 11 - 20 lbs.  Discussed clinic routines, schedule of care and testing, genetic screening options, involvement of students and residents under the direct supervision of APPs and doctors and presence of female providers. Pt verbalized understanding. Future Appointments  Date Time Provider Department Center  01/14/2024  3:10 PM Synthia Raisin, CNM [facility] None  02/12/2024  3:10 PM Cleatus Moccasin, MD [facility] None  02/19/2024  2:15 PM Alm Delon SAILOR, DO [facility] None     Delores Nidia CROME, [place] 12/17/2023 3:41 PM

## 2023-12-17 NOTE — Patient Instructions (Signed)
   Considering Waterbirth? Guide for patients at [facility]) Why consider waterbirth? Gentle birth for babies  Less pain medicine used in labor  May allow for passive descent/less pushing  May reduce perineal tears  More mobility and instinctive maternal position changes  Increased maternal relaxation   Is waterbirth safe? What are the risks of infection, drowning or other complications? Infection:  Very low risk (3.7 % for tub vs 4.8% for bed)  7 in 8000 waterbirths with documented infection  Poorly cleaned equipment most common cause  Slightly lower group B strep transmission rate  Drowning  Maternal:  Very low risk  Related to seizures or fainting  Newborn:  Very low risk. No evidence of increased risk of respiratory problems in multiple large studies  Physiological protection from breathing under water  Avoid underwater birth if there are any fetal complications  Once baby's head is out of the water, keep it out.  Birth complication  Some reports of cord trauma, but risk decreased by bringing baby to surface gradually  No evidence of increased risk of shoulder dystocia. Mothers can usually change positions faster in water than in a bed, possibly aiding the maneuvers to free the shoulder.   There are 2 things you MUST do to have a waterbirth with [facility]: Attend a waterbirth class at [facility] at [facility]   3rd Wednesday of every month from 7-9 pm (virtual during COVID) [employer] at [email or web address] or [email or web address] or by calling [phone] Bring us  the certificate from the class to your prenatal appointment or send via MyChart Meet with a midwife at 36 weeks* to see if you can still plan a waterbirth and to sign the consent.   *We also recommend that you schedule as many of your prenatal visits with a midwife as possible.    Helpful information: You may want to bring a bathing suit top to the hospital  to wear during labor but this is optional.  All other supplies are provided by the hospital. Please arrive at the hospital with signs of active labor, and do not wait at home until late in labor. It takes 45 min- 1 hour for fetal monitoring, and check in to your room to take place, plus transport and filling of the waterbirth tub.    Things that would prevent you from having a waterbirth: Premature, <37wks  Previous cesarean birth  Presence of thick meconium-stained fluid  Multiple gestation (Twins, triplets, etc.)  Uncontrolled diabetes or gestational diabetes requiring medication  Hypertension diagnosed in pregnancy or preexisting hypertension (gestational hypertension, preeclampsia, or chronic hypertension) Fetal growth restriction (your baby measures less than 10th percentile on ultrasound) Heavy vaginal bleeding  Non-reassuring fetal heart rate  Active infection (MRSA, etc.). Group B Strep is NOT a contraindication for waterbirth.  If your labor has to be induced and induction method requires continuous monitoring of the baby's heart rate  Other risks/issues identified by your obstetrical provider   Please remember that birth is unpredictable. Under certain unforeseeable circumstances your provider may advise against giving birth in the tub. These decisions will be made on a case-by-case basis and with the safety of you and your baby as our highest priority.    Updated 07/12/21

## 2023-12-18 ENCOUNTER — Ambulatory Visit: Payer: Self-pay | Admitting: Obstetrics and Gynecology

## 2023-12-18 LAB — HEMOGLOBIN A1C
Est. average glucose Bld gHb Est-mCnc: 94 mg/dL
Hgb A1c MFr Bld: 4.9 % (ref 4.8–5.6)

## 2023-12-23 ENCOUNTER — Other Ambulatory Visit: Payer: Self-pay

## 2023-12-23 ENCOUNTER — Ambulatory Visit: Payer: Self-pay | Admitting: Obstetrics and Gynecology

## 2023-12-23 ENCOUNTER — Telehealth: Payer: Self-pay

## 2023-12-23 DIAGNOSIS — Z3402 Encounter for supervision of normal first pregnancy, second trimester: Secondary | ICD-10-CM

## 2024-01-02 ENCOUNTER — Encounter: Payer: Self-pay | Admitting: Obstetrics and Gynecology

## 2024-01-06 ENCOUNTER — Encounter: Payer: Self-pay | Admitting: Family Medicine

## 2024-01-06 ENCOUNTER — Ambulatory Visit: Admitting: Family Medicine

## 2024-01-06 ENCOUNTER — Ambulatory Visit: Payer: Self-pay

## 2024-01-06 VITALS — BP 124/72 | HR 70 | Temp 97.9°F | Ht 62.0 in | Wt 156.6 lb

## 2024-01-06 DIAGNOSIS — R051 Acute cough: Secondary | ICD-10-CM | POA: Diagnosis not present

## 2024-01-06 DIAGNOSIS — J029 Acute pharyngitis, unspecified: Secondary | ICD-10-CM

## 2024-01-06 LAB — POC COVID19 BINAXNOW: SARS Coronavirus 2 Ag: NEGATIVE

## 2024-01-06 LAB — POCT RAPID STREP A (OFFICE): Rapid Strep A Screen: NEGATIVE

## 2024-01-06 NOTE — Patient Instructions (Signed)
 Good to see you- I will be in touch with your RSV.  If you need to get a rapid test you might try calling a couple of urgent care clinics and see if they offer an in - office rapid RSV test Please let me know if you are not feeling better in the next few days- Sooner if worse.

## 2024-01-06 NOTE — Progress Notes (Signed)
 [facility] at [facility] 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 very pleasant female patient who presents with the following:  Pt seen today for an acute sick visit, primary pt of Melissa I have not seen her myself since 2019 She is currently pregnant and due in March of next year!    Discussed the use of AI scribe software for clinical note transcription with the patient, who gave verbal consent to proceed.  History of Present Illness Renee Miller is a 18 year old female who presents with cough and sore throat.  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 She has not experienced fever, vomiting, or diarrhea and has not taken any COVID tests at home. Initially, she had some ear discomfort, which resolved after the first day.  She is currently [redacted] weeks pregnant and has not experienced any bleeding or fluid loss. She has not yet felt fetal movement.    Patient Active Problem List   Diagnosis Date Noted   Rh negative state in antepartum period 12/05/2023   Encounter for supervision of normal first pregnancy in second trimester 12/04/2023   Gastroesophageal reflux disease without esophagitis 07/17/2023   SOB (shortness of breath) 07/17/2023   Palpitations 07/17/2023   Tachycardia 06/05/2023   Rhinorrhea 05/03/2023   Weight loss 04/15/2023   Acne 08/14/2022   Galactorrhea 08/14/2022   Irregular menstrual bleeding 08/16/2021   Abnormal breast  tissue 09/07/2020   Severe episode of recurrent major depressive disorder, without psychotic features (HCC) 07/31/2020   MDD (major depressive disorder), recurrent episode, severe (HCC) 07/31/2020   PTSD (post-traumatic stress disorder) 06/06/2020   Panic attacks 06/06/2020   Major depressive disorder, recurrent episode, moderate (HCC) 06/06/2020   Generalized anxiety disorder 06/06/2020   Moderate episode of recurrent major depressive disorder (HCC) 06/06/2020   Anxiety 02/27/2019    Past Medical History:  Diagnosis Date   ADHD    s/p evaluation with Dr. Garnette Altabet 11/21   Depression    H/O seasonal allergies    Irregular menstrual bleeding 08/16/2021    Past Surgical History:  Procedure Laterality Date   ADENOIDECTOMY, TONSILLECTOMY AND MYRINGOTOMY WITH TUBE PLACEMENT Bilateral 2010   EUSTACHIAN TUBE DILATION Left 2017    Social History   Tobacco Use   Smoking status: Never   Smokeless tobacco: Never  Vaping Use   Vaping status: Never Used  Substance Use Topics   Alcohol use: Not Currently   Drug use: Never    Family History  Problem Relation Age of Onset   OCD Mother    Hypertension Mother    Healthy Father    Drug abuse Maternal Grandfather    Hypertension Maternal Grandfather    Hyperlipidemia Maternal Grandfather    Heart disease Maternal Grandfather    Alcohol abuse Maternal Grandfather    Bipolar disorder Maternal Grandmother    Depression Maternal Grandmother    Drug abuse Maternal  Grandmother    Alcohol abuse Maternal Grandmother    Diabetes Paternal Grandmother     Allergies  Allergen Reactions   Amoxicillin Hives    Medication list has been reviewed and updated.  Current Outpatient Medications on File Prior to Visit  Medication Sig Dispense Refill   escitalopram  (LEXAPRO ) 10 MG tablet Take 1 tablet (10 mg total) by mouth daily. 30 tablet 3   Prenatal Vit-Fe Fumarate-FA (MULTIVITAMIN-PRENATAL) 27-0.8 MG TABS tablet Take 1 tablet by  mouth daily at 12 noon.     hydrOXYzine  (VISTARIL ) 25 MG capsule Take 1 capsule (25 mg total) by mouth every 8 (eight) hours as needed (anxiety or insomnia). (Patient not taking: Reported on 01/06/2024) 90 capsule 1   LORazepam  (ATIVAN ) 0.5 MG tablet Take 1 tablet (0.5 mg total) by mouth daily as needed for anxiety. (Patient not taking: Reported on 01/06/2024) 30 tablet 0   tretinoin  (RETIN-A ) 0.025 % cream Apply every Monday, Wednesday, and Friday at bedtime. (Patient not taking: Reported on 01/06/2024) 45 g 1   No current facility-administered medications on file prior to visit.    Review of Systems:  As per HPI- otherwise negative.   Physical Examination: Vitals:   01/06/24 1105 01/06/24 1224  BP: 124/72   Pulse: (!) 103 70  Temp: 97.9 F (36.6 C)   SpO2: 99%    Vitals:   01/06/24 1105  Weight: 156 lb 9.6 oz (71 kg)  Height: 5' 2 (1.575 m)   Body mass index is 28.64 kg/m. Ideal Body Weight: Weight in (lb) to have BMI = 25: 136.4  GEN: no acute distress. Pregnant, looks well  HEENT: Atraumatic, Normocephalic.  Bilateral TM wnl, oropharynx normal.  PEERL,EOMI.   Ears and Nose: No external deformity. CV: RRR, No M/G/R. No JVD. No thrill. No extra heart sounds. PULM: CTA B, no wheezes, crackles, rhonchi. No retractions. No resp. distress. No accessory muscle use. ABD: S, NT, ND, +BS. No rebound. No HSM. Benign belly, fundus c/w dates  EXTR: No c/c/e PSYCH: Normally interactive. Conversant.   Results for orders placed or performed in visit on 01/06/24  POC COVID-19 BinaxNow   Collection Time: 01/06/24 11:36 AM  Result Value Ref Range   SARS Coronavirus 2 Ag Negative Negative  POCT rapid strep A   Collection Time: 01/06/24 11:36 AM  Result Value Ref Range   Rapid Strep A Screen Negative Negative    Assessment and Plan: Acute cough - Plan: POC COVID-19 BinaxNow, RSV, NAA  Sore throat - Plan: POCT rapid strep A, RSV, NAA  Assessment & Plan Acute upper respiratory  infection Symptoms suggest possible RSV exposure. COVID-19 and RSV considered in differential diagnosis. Flu unlikely. - Perform COVID-19 test. - Perform throat swab for further analysis. - Send out RSV test if initial tests are inconclusive.  Pregnancy, [redacted] weeks gestation [redacted] weeks pregnant. Awaiting fetal movements. Expecting a girl.  Signed Harlene Schroeder, MD

## 2024-01-06 NOTE — Telephone Encounter (Signed)
 FYI Only or Action Required?: FYI only for provider.  Patient was last seen in primary care on 07/17/2023 by Renee Setter, NP.  Called Nurse Triage reporting Sore Throat.  Symptoms began several days ago.  Interventions attempted: Nothing.  Symptoms are: unchanged.  Triage Disposition: See PCP When Office is Open (Within 3 Days)  Patient/caregiver understands and will follow disposition?: Yes    Copied from CRM #[id number]. Topic: Clinical - Red Word Triage >> Jan 06, 2024 10:28 AM Alfonso ORN wrote: Pt is having cold symptoms: severe sore throat, cough , aches and runny nose. 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 ONSET: When did the throat start hurting? (Hours or days ago)      Three days ago 2. SEVERITY: How bad is the sore throat? (Scale 1-10; mild, moderate or severe)     moderate 3. STREP EXPOSURE: Has there been any exposure to strep within the past week? If Yes, ask: What type of contact occurred?      Child at work had rsv 4.  VIRAL SYMPTOMS: Are there any symptoms of a cold, such as a runny nose, cough, hoarse voice or red eyes?      Runny nose, cough 5. FEVER: Do you have a fever? If Yes, ask: What is your temperature, how was it measured, and when did it start?     no 6. PUS ON THE TONSILS: Is there pus on the tonsils in the back of your throat?     denies 7. OTHER SYMPTOMS: Do you have any other symptoms? (e.g., difficulty breathing, headache, rash)     headache 8. PREGNANCY: Is there any chance you are pregnant? When was your last menstrual period?     na  Protocols used: Sore Throat-A-AH

## 2024-01-09 ENCOUNTER — Encounter: Payer: Self-pay | Admitting: Family Medicine

## 2024-01-09 LAB — RSV, NAA: RSV, NAA: NOT DETECTED

## 2024-01-10 ENCOUNTER — Other Ambulatory Visit: Payer: Self-pay

## 2024-01-10 ENCOUNTER — Encounter (HOSPITAL_COMMUNITY): Payer: Self-pay | Admitting: Family Medicine

## 2024-01-10 ENCOUNTER — Ambulatory Visit: Payer: Self-pay | Admitting: Advanced Practice Midwife

## 2024-01-10 ENCOUNTER — Inpatient Hospital Stay (HOSPITAL_COMMUNITY)
Admission: AD | Admit: 2024-01-10 | Discharge: 2024-01-10 | Disposition: A | Discharge status: Home / Self Care | Attending: Family Medicine | Admitting: Family Medicine

## 2024-01-10 ENCOUNTER — Encounter: Payer: Self-pay | Admitting: Obstetrics and Gynecology

## 2024-01-10 DIAGNOSIS — O26892 Other specified pregnancy related conditions, second trimester: Secondary | ICD-10-CM | POA: Diagnosis not present

## 2024-01-10 DIAGNOSIS — N898 Other specified noninflammatory disorders of vagina: Secondary | ICD-10-CM | POA: Diagnosis not present

## 2024-01-10 DIAGNOSIS — Z0371 Encounter for suspected problem with amniotic cavity and membrane ruled out: Secondary | ICD-10-CM | POA: Diagnosis not present

## 2024-01-10 DIAGNOSIS — Z3A18 18 weeks gestation of pregnancy: Secondary | ICD-10-CM | POA: Diagnosis not present

## 2024-01-10 LAB — WET PREP, GENITAL
Sperm: NONE SEEN
Trich, Wet Prep: NONE SEEN
WBC, Wet Prep HPF POC: <10 (ref <10)
Yeast Wet Prep HPF POC: NONE SEEN

## 2024-01-10 LAB — RUPTURE OF MEMBRANE (ROM)PLUS: Rom Plus: NEGATIVE

## 2024-01-10 LAB — POCT FERN TEST: POCT Fern Test: NEGATIVE

## 2024-01-10 MED ORDER — METRONIDAZOLE 500 MG PO TABS
500.0000 mg | ORAL_TABLET | Freq: Two times a day (BID) | ORAL | 0 refills | Status: AC
  Filled 2024-01-10: qty 14, 7d supply, fill #0

## 2024-01-10 NOTE — MAU Note (Signed)
 Renee Miller is a 18 y.o. at [redacted]w[redacted]d here in MAU reporting: an increase in vaginal discharge this am. Clear and watery, only present for a few minutes after using the bathroom. Has not noticed anymore leaking since then. Denies any VB or CTXs. Denies any recent sexual intercourse. Patient denies any unusual vaginal discharge, vaginal odor, itching or pain.   Onset of complaint: today Pain score: 0 Vitals:   01/10/24 1533  BP: 116/61  Pulse: (!) 52  Resp: 17  Temp: 98.8 F (37.1 C)  SpO2: 97%     FHT:147 Lab orders placed from triage:

## 2024-01-10 NOTE — MAU Provider Note (Cosign Needed Addendum)
 Chief Complaint: Rupture of Membranes   None     SUBJECTIVE HPI: Renee Miller is a 18 y.o. G1P0000 at [redacted]w[redacted]d by LMP who presents to maternity admissions reporting clear vaginal discharge this AM. She reports that she went to the restroom normally, but after a little while she noticed that her underwear was damp. There was a small amount of clear, non bloody discharge that had no particular odor. There has been no further discharge since this AM, but she asked her OB and they recommended that she come be evaluated just to be sure. Last intercourse was 2 days ago, no discharge at that time. She denies vaginal bleeding, vaginal itching/burning, urinary symptoms, h/a, dizziness, n/v, or fever/chills. Denies abdominal pain, cramps. No concern for STIs, currently sexually active with one female partner.  HPI  Past Medical History:  Diagnosis Date   ADHD    s/p evaluation with Dr. Garnette Altabet 11/21   Depression    H/O seasonal allergies    Irregular menstrual bleeding 08/16/2021   Past Surgical History:  Procedure Laterality Date   ADENOIDECTOMY, TONSILLECTOMY AND MYRINGOTOMY WITH TUBE PLACEMENT Bilateral 2010   EUSTACHIAN TUBE DILATION Left 2017   Social History   Socioeconomic History   Marital status: Single    Spouse name: Not on file   Number of children: Not on file   Years of education: Not on file   Highest education level: 12th grade  Occupational History   Occupation: Consulting civil engineer   Occupation: Geophysicist/field seismologist  Tobacco Use   Smoking status: Never   Smokeless tobacco: Never  Vaping Use   Vaping status: Never Used  Substance and Sexual Activity   Alcohol use: Not Currently   Drug use: Never   Sexual activity: Yes    Comment: last SI: 10/1  Other Topics Concern   Not on file  Social History Narrative   Lives with her mom. No significant relationship with biological father. [place] in [place].   Now reporting [redacted] weeks pregnant. Trying to make decision on  whether she would like to continue pregnancy.   Says she will make decision by 10  weeks.         Grandparents in in [place]   Has extended family in [place]      Social Drivers of Health   Financial Resource Strain: Low Risk  (07/17/2023)   Overall Financial Resource Strain (CARDIA)    Difficulty of Paying Living Expenses: Not very hard  Food Insecurity: No Food Insecurity (07/17/2023)   Hunger Vital Sign    Worried About Running Out of Food in the Last Year: Never true    Ran Out of Food in the Last Year: Never true  Transportation Needs: No Transportation Needs (07/17/2023)   PRAPARE - Administrator, Civil Service (Medical): No    Lack of Transportation (Non-Medical): No  Physical Activity: Insufficiently Active (07/17/2023)   Exercise Vital Sign    Days of Exercise per Week: 3 days    Minutes of Exercise per Session: 40 min  Stress: Stress Concern Present (07/17/2023)   [employer] of Occupational Health - Occupational Stress Questionnaire    Feeling of Stress : To some extent  Social Connections: Unknown (07/17/2023)   Social Connection and Isolation Panel    Frequency of Communication with Friends and Family: More than three times a week    Frequency of Social Gatherings with Friends and Family: More than three times a week    Attends  Religious Services: Never    Active Member of Clubs or Organizations: No    Attends Banker Meetings: Not on file    Marital Status: Patient declined  Intimate Partner Violence: Not At Risk (06/06/2020)   Humiliation, Afraid, Rape, and Kick questionnaire    Fear of Current or Ex-Partner: No    Emotionally Abused: No    Physically Abused: No    Sexually Abused: No   No current facility-administered medications on file prior to encounter.   Current Outpatient Medications on File Prior to Encounter  Medication Sig Dispense Refill   escitalopram  (LEXAPRO ) 10 MG tablet Take 1 tablet (10 mg total) by mouth daily. 30 tablet 3    Prenatal Vit-Fe Fumarate-FA (MULTIVITAMIN-PRENATAL) 27-0.8 MG TABS tablet Take 1 tablet by mouth daily at 12 noon.     hydrOXYzine  (VISTARIL ) 25 MG capsule Take 1 capsule (25 mg total) by mouth every 8 (eight) hours as needed (anxiety or insomnia). (Patient not taking: Reported on 01/06/2024) 90 capsule 1   LORazepam  (ATIVAN ) 0.5 MG tablet Take 1 tablet (0.5 mg total) by mouth daily as needed for anxiety. (Patient not taking: Reported on 01/06/2024) 30 tablet 0   tretinoin  (RETIN-A ) 0.025 % cream Apply every Monday, Wednesday, and Friday at bedtime. (Patient not taking: Reported on 01/06/2024) 45 g 1   Allergies  Allergen Reactions   Amoxicillin Hives    ROS:  Review of Systems  Constitutional:  Negative for chills, fatigue and fever.  Respiratory:  Negative for shortness of breath.   Cardiovascular:  Negative for chest pain.  Gastrointestinal:  Negative for abdominal pain.  Genitourinary:  Positive for vaginal discharge. Negative for difficulty urinating, dysuria, flank pain, pelvic pain, vaginal bleeding and vaginal pain.  Neurological:  Negative for dizziness and headaches.  Psychiatric/Behavioral: Negative.       I have reviewed patient's Past Medical Hx, Surgical Hx, Family Hx, Social Hx, medications and allergies.   Physical Exam  Patient Vitals for the past 24 hrs:  BP Temp Temp src Pulse Resp SpO2 Height Weight  01/10/24 1550 117/73 -- -- 83 -- -- -- --  01/10/24 1533 116/61 98.8 F (37.1 C) Oral (!) 52 17 97 % 5' 2 (1.575 m) 72.1 kg   Constitutional: Well-developed, well-nourished female in no acute distress.  Cardiovascular: normal rate Respiratory: normal effort GI: Abd soft, non-tender. Pos BS x 4 MS: Extremities nontender, no edema, normal ROM Neurologic: Alert and oriented x 4.  GU: Neg CVAT.   PELVIC EXAM: Cervix pink, visually closed, without lesion, scant white creamy discharge, vaginal walls and external genitalia normal   LAB RESULTS Results for orders  placed or performed during the hospital encounter of 01/10/24 (from the past 24 hours)  Fern Test     Status: None   Collection Time: 01/10/24  5:06 PM  Result Value Ref Range   POCT Fern Test Negative = intact amniotic membranes     A/Negative/-- (08/27 1614)  IMAGING US  OB Comp Less 14 Wks Result Date: 12/22/2023 CLINICAL DATA:  Dating. LMP: 09/02/2023 corresponding to an estimated gestational age of [redacted] weeks, 5 days. EXAM: OBSTETRIC <14 WK ULTRASOUND TECHNIQUE: Transabdominal ultrasound was performed for evaluation of the gestation as well as the maternal uterus and adnexal regions. COMPARISON:  None Available. FINDINGS: Intrauterine gestational sac: Single intrauterine gestational sac. Yolk sac:  Not seen Embryo:  Present Cardiac Activity: Detected Heart Rate: 162 bpm CRL:   88 mm   14 w 3 d  US  EDC: 06/10/2024 Subchorionic hemorrhage: Subchorionic hemorrhage measures 5.1 x 4.4 x 1.6 cm. Maternal uterus/adnexae: The right ovary is not visualized. The left ovary is unremarkable. IMPRESSION: Single live intrauterine pregnancy with an estimated gestational age of [redacted] weeks, 3 days by ultrasound concordant with age based on LMP. Electronically Signed   By: Vanetta Chou M.D.   On: 12/22/2023 20:38    MAU Management/MDM: Orders Placed This Encounter  Procedures   Wet prep, genital   Rupture of Membrane (ROM) Plus   Fern Test   Discharge patient Discharge disposition: 01-Home or Self Care; Discharge patient date: 01/10/2024    No orders of the defined types were placed in this encounter.   Negative pooling, ferning, ROM plus. Low concern for PPROM. Reassurance and return precautions provided.   ASSESSMENT 1. Encounter for suspected premature rupture of amniotic membranes, with rupture of membranes not found   2. [redacted] weeks gestation of pregnancy     PLAN Discharge home Allergies as of 01/10/2024       Reactions   Amoxicillin Hives        Medication List      STOP taking these medications    LORazepam  0.5 MG tablet Commonly known as: Ativan    tretinoin  0.025 % cream Commonly known as: RETIN-A        TAKE these medications    escitalopram  10 MG tablet Commonly known as: Lexapro  Take 1 tablet (10 mg total) by mouth daily.   hydrOXYzine  25 MG capsule Commonly known as: VISTARIL  Take 1 capsule (25 mg total) by mouth every 8 (eight) hours as needed (anxiety or insomnia).   multivitamin-prenatal 27-0.8 MG Tabs tablet Take 1 tablet by mouth daily at 12 noon.        Follow-up Information     [facility] Follow up.   Specialty: Obstetrics and Gynecology Why: As needed for emergencies Contact information: [address] [place]  [address] [phone]               Leafy Scriver, DO   Midwife Attestation:  I personally saw and evaluated the patient, performing the key elements of the service. I developed and verified the management plan that is described in the resident's/student's note, and I agree with the content with my edits above. VSS, HRR&R, Resp unlabored, Legs neg.  Olam Boards Certified Nurse-Midwife 01/10/2024  5:18 PM

## 2024-01-10 NOTE — Discharge Instructions (Signed)
 Reasons to return to MAU at [facility]:  Since you are preterm, return to MAU if:  1.  Contractions are 10 minutes apart or less and they becoming more uncomfortable or painful over time 2.  You have a large gush of fluid, or a trickle of fluid that will not stop and you have to wear a pad 3.  You have bleeding that is bright red, heavier than spotting--like menstrual bleeding (spotting can be normal in early labor or after a check of your cervix) 4.  You do not feel the baby moving like he/she normally does

## 2024-01-13 ENCOUNTER — Other Ambulatory Visit (HOSPITAL_BASED_OUTPATIENT_CLINIC_OR_DEPARTMENT_OTHER): Payer: Self-pay

## 2024-01-14 ENCOUNTER — Ambulatory Visit (INDEPENDENT_AMBULATORY_CARE_PROVIDER_SITE_OTHER)

## 2024-01-14 VITALS — BP 126/84 | HR 93 | Wt 160.0 lb

## 2024-01-14 DIAGNOSIS — Z3A19 19 weeks gestation of pregnancy: Secondary | ICD-10-CM | POA: Diagnosis not present

## 2024-01-14 DIAGNOSIS — Z23 Encounter for immunization: Secondary | ICD-10-CM

## 2024-01-14 DIAGNOSIS — Z3400 Encounter for supervision of normal first pregnancy, unspecified trimester: Secondary | ICD-10-CM

## 2024-01-14 NOTE — Progress Notes (Signed)
   LOW-RISK PREGNANCY OFFICE VISIT  Patient name: Renee Miller MRN [id number]  Date of birth: March 08, 2006 Chief Complaint:   Routine Prenatal Visit  Subjective:   Renee Miller is a 18 y.o. G22P0000 female at [redacted]w[redacted]d with an Estimated Date of Delivery: 06/09/24 being seen today for ongoing management of a low-risk pregnancy aeb has Anxiety; PTSD (post-traumatic stress disorder); Panic attacks; Major depressive disorder, recurrent episode, moderate (HCC); Generalized anxiety disorder; Moderate episode of recurrent major depressive disorder (HCC); Severe episode of recurrent major depressive disorder, without psychotic features (HCC); MDD (major depressive disorder), recurrent episode, severe (HCC); Abnormal breast tissue; Irregular menstrual bleeding; Acne; Galactorrhea; Weight loss; Rhinorrhea; Tachycardia; Gastroesophageal reflux disease without esophagitis; SOB (shortness of breath); Palpitations; Supervision of normal first pregnancy, antepartum; and Rh negative state in antepartum period on their problem list.  Patient presents today, alone, with no complaints.  Patient endorses occasional fetal movement. Patient denies abdominal cramping or contractions.  Patient denies vaginal concerns including abnormal discharge, leaking of fluid, and bleeding. No issues with urination, constipation, or diarrhea.    Contractions: Not present. Vag. Bleeding: None.  Movement: Present.  Reviewed past medical,surgical, social, obstetrical and family history as well as problem list, medications and allergies.  Objective   Vitals:   01/14/24 1528  BP: 126/84  Pulse: 93  Weight: 160 lb (72.6 kg)  Body mass index is 29.26 kg/m.  Total Weight Gain:33 lb (15 kg)         Physical Examination:   General appearance: Well appearing, and in no distress  Mental status: Alert, oriented to person, place, and time  Skin: Warm & dry  Cardiovascular: Normal heart rate noted  Respiratory: Normal respiratory  effort, no distress  Abdomen: Soft, gravid, nontender, AGA with fundus at umbilicus   Pelvic: Cervical exam deferred           Extremities: Edema: None  Fetal Status: Fetal Heart Rate (bpm): 152  Movement: Present   No results found for this or any previous visit (from the past 24 hours).   Assessment & Plan   Low-risk pregnancy of a 18 y.o., G1P0000 at 101w0d with an Estimated Date of Delivery: 06/09/24   1. Supervision of normal first pregnancy, antepartum -Anticipatory guidance for upcoming appts. -Patient to schedule next appt in 4 weeks for an in-person visit.   2. [redacted] weeks gestation of pregnancy -Doing well. -Informed of upcoming US  on 01/26/2024.      Meds: No orders of the defined types were placed in this encounter.  Labs/procedures today:  Lab Orders  No laboratory test(s) ordered today     Reviewed: Preterm labor symptoms and general obstetric precautions including but not limited to vaginal bleeding, contractions, leaking of fluid and fetal movement were reviewed in detail with the patient.  All questions were answered.  Follow-up: No follow-ups on file.  No orders of the defined types were placed in this encounter.  Harlene LITTIE Duncans MSN, CNM 01/14/2024

## 2024-01-27 ENCOUNTER — Encounter: Payer: Self-pay | Admitting: Family Medicine

## 2024-01-28 ENCOUNTER — Encounter: Payer: Self-pay | Admitting: Obstetrics and Gynecology

## 2024-02-05 ENCOUNTER — Ambulatory Visit: Attending: Obstetrics and Gynecology

## 2024-02-05 ENCOUNTER — Ambulatory Visit (HOSPITAL_BASED_OUTPATIENT_CLINIC_OR_DEPARTMENT_OTHER): Admitting: Obstetrics

## 2024-02-05 VITALS — BP 144/72 | HR 103

## 2024-02-05 DIAGNOSIS — F419 Anxiety disorder, unspecified: Secondary | ICD-10-CM | POA: Diagnosis not present

## 2024-02-05 DIAGNOSIS — O321XX0 Maternal care for breech presentation, not applicable or unspecified: Secondary | ICD-10-CM | POA: Insufficient documentation

## 2024-02-05 DIAGNOSIS — Z3402 Encounter for supervision of normal first pregnancy, second trimester: Secondary | ICD-10-CM | POA: Insufficient documentation

## 2024-02-05 DIAGNOSIS — Z3689 Encounter for other specified antenatal screening: Secondary | ICD-10-CM

## 2024-02-05 DIAGNOSIS — Z3A22 22 weeks gestation of pregnancy: Secondary | ICD-10-CM | POA: Diagnosis not present

## 2024-02-05 DIAGNOSIS — O99342 Other mental disorders complicating pregnancy, second trimester: Secondary | ICD-10-CM | POA: Insufficient documentation

## 2024-02-05 DIAGNOSIS — O358XX0 Maternal care for other (suspected) fetal abnormality and damage, not applicable or unspecified: Secondary | ICD-10-CM | POA: Insufficient documentation

## 2024-02-05 NOTE — Progress Notes (Signed)
 MFM Consult Note  Renee Miller is currently at 22 weeks and 1 day.  She was seen for detailed fetal anatomy scan.  She denies any significant past medical history and denies any problems in her current pregnancy.    She has declined all screening tests for fetal aneuploidy and her current pregnancy.  Sonographic findings Single intrauterine pregnancy at 22w 1d. Fetal cardiac activity:  Observed and appears normal. Presentation: Breech. The anatomic structures that were well seen appear normal without evidence of soft markers. The anatomic survey is complete.  Fetal biometry shows the estimated fetal weight at the 49th percentile. Amniotic fluid: Within normal limits.  MVP: 4.26 cm. Placenta: Posterior. Adnexa: No abnormality visualized. Cervical length: 3 cm.  The patient was informed that anomalies may be missed due to technical limitations. If the fetus is in a suboptimal position or maternal habitus is increased, visualization of the fetus in the maternal uterus may be impaired.  As the views of the fetal anatomy were visualized today, no further exams were scheduled in our office.    The patient stated that all of her questions were answered today.  A total of 30 minutes was spent counseling, reviewing her chart, and coordinating the care for this patient.  Greater than 50% of the time was spent in direct face-to-face contact.

## 2024-02-10 NOTE — Progress Notes (Unsigned)
   PRENATAL VISIT NOTE  Subjective:  Renee Miller is a 17 y.o. G1P0000 at [redacted]w[redacted]d being seen today for ongoing prenatal care.  She is currently monitored for the following issues for this low-risk pregnancy and has Anxiety; PTSD (post-traumatic stress disorder); Panic attacks; Major depressive disorder, recurrent episode, moderate (HCC); Generalized anxiety disorder; Galactorrhea; Tachycardia; Gastroesophageal reflux disease without esophagitis; SOB (shortness of breath); Palpitations; Supervision of normal first pregnancy, antepartum; and Rh negative state in antepartum period on their problem list.  Patient reports no complaints - occasional HA but resolves with rest/hydration.  Contractions: Not present. Vag. Bleeding: None.  Movement: Present. Denies leaking of fluid.   The following portions of the patient's history were reviewed and updated as appropriate: allergies, current medications, past family history, past medical history, past social history, past surgical history and problem list.   Objective:   Vitals:   02/12/24 1515  BP: 122/74  Pulse: (!) 102  Weight: 168 lb 0.6 oz (76.2 kg)    Fetal Status:  Fetal Heart Rate (bpm): 150 Fundal Height: 23 cm Movement: Present    General: Alert, oriented and cooperative. Patient is in no acute distress.  Skin: Skin is warm and dry. No rash noted.   Cardiovascular: Normal heart rate noted  Respiratory: Normal respiratory effort, no problems with respiration noted  Abdomen: Soft, gravid, appropriate for gestational age.  Pain/Pressure: Absent     Pelvic: Cervical exam deferred        Extremities: Normal range of motion.  Edema: None  Mental Status: Normal mood and affect. Normal behavior. Normal judgment and thought content.   Assessment and Plan:  Pregnancy: G1P0000 at [redacted]w[redacted]d 1. Supervision of normal first pregnancy, antepartum (Primary) 28w labs next time.  Anatomy US  complete/normal.  Reviewed other measures for HA. Reviewed  red flags for HA.   2. Pregnancy with 23 completed weeks gestation  3. Rh negative state in antepartum period Rhogam as indicated.   Preterm labor symptoms and general obstetric precautions including but not limited to vaginal bleeding, contractions, leaking of fluid and fetal movement were reviewed in detail with the patient. Please refer to After Visit Summary for other counseling recommendations.   Return in about 4 weeks (around 03/11/2024) for OB VISIT, MD or APP, 2 hr GTT/28w labs.  Future Appointments  Date Time Provider Department Center  02/19/2024  2:15 PM Alm Delon SAILOR, DO [facility] None  02/24/2024  1:30 PM Teresa Redell LABOR, NP [facility] None  03/10/2024  8:15 AM Synthia Raisin, CNM [facility] None    Vina Solian, MD

## 2024-02-12 ENCOUNTER — Ambulatory Visit (INDEPENDENT_AMBULATORY_CARE_PROVIDER_SITE_OTHER): Admitting: Obstetrics and Gynecology

## 2024-02-12 VITALS — BP 122/74 | HR 102 | Wt 168.0 lb

## 2024-02-12 DIAGNOSIS — Z3400 Encounter for supervision of normal first pregnancy, unspecified trimester: Secondary | ICD-10-CM | POA: Diagnosis not present

## 2024-02-12 DIAGNOSIS — Z3A23 23 weeks gestation of pregnancy: Secondary | ICD-10-CM | POA: Diagnosis not present

## 2024-02-12 DIAGNOSIS — Z6791 Unspecified blood type, Rh negative: Secondary | ICD-10-CM

## 2024-02-13 ENCOUNTER — Encounter: Payer: Self-pay | Admitting: Obstetrics and Gynecology

## 2024-02-19 ENCOUNTER — Other Ambulatory Visit (HOSPITAL_BASED_OUTPATIENT_CLINIC_OR_DEPARTMENT_OTHER): Payer: Self-pay

## 2024-02-19 ENCOUNTER — Other Ambulatory Visit: Payer: Self-pay

## 2024-02-19 ENCOUNTER — Ambulatory Visit: Admitting: Dermatology

## 2024-02-19 ENCOUNTER — Encounter: Payer: Self-pay | Admitting: Dermatology

## 2024-02-19 VITALS — BP 127/68 | HR 108

## 2024-02-19 DIAGNOSIS — L700 Acne vulgaris: Secondary | ICD-10-CM

## 2024-02-19 MED ORDER — AZELEX 20 % EX CREA
TOPICAL_CREAM | Freq: Every day | CUTANEOUS | 6 refills | Status: AC
  Filled 2024-02-19: qty 50, 30d supply, fill #0

## 2024-02-19 MED ORDER — CLINDAMYCIN PHOS-BENZOYL PEROX 1.2-3.75 % EX GEL: 1.0000 | Freq: Every evening | CUTANEOUS | 6 refills | Status: AC

## 2024-02-19 NOTE — Patient Instructions (Addendum)
 VISIT SUMMARY:  Today, we discussed managing your acne during pregnancy. You shared that you stopped using tretinoin  and experienced dryness with clindamycin  swabs. We reviewed your current skincare routine and explored safe treatment options for your acne while pregnant.  YOUR PLAN:  -ACNE VULGARIS IN PREGNANCY:  Acne vulgaris is a common skin condition that causes pimples and can be more challenging to manage during pregnancy due to limited safe treatment options. We have prescribed Onexton cream for morning use, which is safe during pregnancy.   You should apply Vanicream with hyaluronic acid first, followed by Onexton cream, and then a heavier cream at night.   We also provided a sample of Sycophate by Avene for nighttime use. Use azelaic acid every other night when not using Onexton.   If your skin becomes too dry, reduce Onextin to morning use only and use azelaic acid nightly. We also discussed the potential use of Accutane after you finish breastfeeding, with necessary precautions including birth control.  INSTRUCTIONS:  Please follow the skincare routine as discussed: apply Vanicream with hyaluronic acid first, followed by Onextin cream in the morning, and a heavier cream at night. Use azelaic acid every other night when not using Onextin. If your skin becomes too dry, reduce Onextin to morning use only and use azelaic acid nightly. We will discuss the potential use of Accutane after you finish breastfeeding, ensuring necessary precautions including birth control.    Important Information  Due to recent changes in healthcare laws, you may see results of your pathology and/or laboratory studies on MyChart before the doctors have had a chance to review them. We understand that in some cases there may be results that are confusing or concerning to you. Please understand that not all results are received at the same time and often the doctors may need to interpret multiple results in  order to provide you with the best plan of care or course of treatment. Therefore, we ask that you please give us  2 business days to thoroughly review all your results before contacting the office for clarification. Should we see a critical lab result, you will be contacted sooner.   If You Need Anything After Your Visit  If you have any questions or concerns for your doctor, please call our main line at [phone] If no one answers, please leave a voicemail as directed and we will return your call as soon as possible. Messages left after 4 pm will be answered the following business day.   You may also send us  a message via MyChart. We typically respond to MyChart messages within 1-2 business days.  For prescription refills, please ask your pharmacy to contact our office. Our fax number is [phone].  If you have an urgent issue when the clinic is closed that cannot wait until the next business day, you can page your doctor at the number below.    Please note that while we do our best to be available for urgent issues outside of office hours, we are not available 24/7.   If you have an urgent issue and are unable to reach us , you may choose to seek medical care at your doctor's office, retail clinic, urgent care center, or emergency room.  If you have a medical emergency, please immediately call 911 or go to the emergency department. In the event of inclement weather, please call our main line at [phone] for an update on the status of any delays or closures.  Dermatology Medication Tips: Please keep  the boxes that topical medications come in in order to help keep track of the instructions about where and how to use these. Pharmacies typically print the medication instructions only on the boxes and not directly on the medication tubes.   If your medication is too expensive, please contact our office at [phone] or send us  a message through MyChart.   We are unable to tell what  your co-pay for medications will be in advance as this is different depending on your insurance coverage. However, we may be able to find a substitute medication at lower cost or fill out paperwork to get insurance to cover a needed medication.   If a prior authorization is required to get your medication covered by your insurance company, please allow us  1-2 business days to complete this process.  Drug prices often vary depending on where the prescription is filled and some pharmacies may offer cheaper prices.  The website www.goodrx.com contains coupons for medications through different pharmacies. The prices here do not account for what the cost may be with help from insurance (it may be cheaper with your insurance), but the website can give you the price if you did not use any insurance.  - You can print the associated coupon and take it with your prescription to the pharmacy.  - You may also stop by our office during regular business hours and pick up a GoodRx coupon card.  - If you need your prescription sent electronically to a different pharmacy, notify our office through [facility] or by phone at [phone]

## 2024-02-19 NOTE — Progress Notes (Signed)
   Follow-Up Visit  Patient (and/or pt guardian) consented to the use of AI-assisted tools for note generation.    Subjective  Renee Miller is a 18 y.o. female who presents for the following: Acne  Patient is currently pregnant  Patient was last evaluated on 02/26/23.  At this visit patient was prescribed Tretinoin  0.025% cream - use nightly on M, W, F.  Clindamycin  swabs - every morning Patient reports she stopped using tretinoin  6 months ago when pregnancy started  Patient is not using clindamycin  swabs - reports that they were drying her skin and burned Recommended La Roche Posay foaming hydrating wash  Patient reports sxs are unchanged.  Patient reports acne is continuous and does not have breaks from breakouts Patient denies medication changes. Patient currently uses: Vanicream face wash & Vanicream hyaluronic acid moisturizer Patient reports she sometimes uses a spot treatment with salicylic acid  The following portions of the chart were reviewed this encounter and updated as appropriate: medications, allergies, medical history  Review of Systems:  No other skin or systemic complaints except as noted in HPI or Assessment and Plan.  Objective  Well appearing patient in no apparent distress; mood and affect are within normal limits.  A focused examination was performed of the following areas: Face  Relevant exam findings are noted in the Assessment and Plan.          Assessment & Plan    Acne vulgaris in pregnancy Acne vulgaris during pregnancy, requiring safe treatment options. Tretinoin  discontinued due to pregnancy. Clindamycin  swabs not tolerated. Onextin (clonamide and benzoyl peroxide ) cream considered safe and effective for pregnancy-related acne. Azelaic acid to be used in combination for pigmentation and inflammation. Discussed potential use of Accutane post-breastfeeding with necessary precautions, including birth control due to teratogenic risks. -  Prescribed Onextin cream for morning use. - Instructed to apply Vanicream hyaluronic acid followed by Onextin cream, then a heavier cream at night. - Provided sample of Cicalfate by Avene for nighttime use. - Use azelaic acid every other night when not using Onextin. - If skin becomes too dry, reduce Onextin to morning use only and use azelaic acid nightly. - Discussed potential use of Accutane post-breastfeeding with necessary precautions, including birth control.    No follow-ups on file.  I, Lyle Cords, as acting as a neurosurgeon for [employer], DO .   Documentation: I have reviewed the above documentation for accuracy and completeness, and I agree with the above.  Delon Lenis, DO

## 2024-02-24 ENCOUNTER — Ambulatory Visit (INDEPENDENT_AMBULATORY_CARE_PROVIDER_SITE_OTHER): Admitting: Behavioral Health

## 2024-02-24 ENCOUNTER — Encounter: Payer: Self-pay | Admitting: Obstetrics and Gynecology

## 2024-02-24 ENCOUNTER — Encounter: Payer: Self-pay | Admitting: Behavioral Health

## 2024-02-24 ENCOUNTER — Inpatient Hospital Stay (HOSPITAL_COMMUNITY)
Admission: AD | Admit: 2024-02-24 | Discharge: 2024-02-24 | Disposition: A | Discharge status: Home / Self Care | Attending: Obstetrics and Gynecology | Admitting: Obstetrics and Gynecology

## 2024-02-24 ENCOUNTER — Other Ambulatory Visit: Payer: Self-pay

## 2024-02-24 ENCOUNTER — Encounter (HOSPITAL_COMMUNITY): Payer: Self-pay | Admitting: Obstetrics and Gynecology

## 2024-02-24 ENCOUNTER — Other Ambulatory Visit (HOSPITAL_BASED_OUTPATIENT_CLINIC_OR_DEPARTMENT_OTHER): Payer: Self-pay

## 2024-02-24 DIAGNOSIS — F410 Panic disorder [episodic paroxysmal anxiety] without agoraphobia: Secondary | ICD-10-CM | POA: Diagnosis not present

## 2024-02-24 DIAGNOSIS — Z0130 Encounter for examination of blood pressure without abnormal findings: Secondary | ICD-10-CM | POA: Insufficient documentation

## 2024-02-24 DIAGNOSIS — F411 Generalized anxiety disorder: Secondary | ICD-10-CM | POA: Diagnosis not present

## 2024-02-24 DIAGNOSIS — Z3A24 24 weeks gestation of pregnancy: Secondary | ICD-10-CM | POA: Diagnosis not present

## 2024-02-24 DIAGNOSIS — F331 Major depressive disorder, recurrent, moderate: Secondary | ICD-10-CM | POA: Diagnosis not present

## 2024-02-24 LAB — URINALYSIS, ROUTINE W REFLEX MICROSCOPIC
Bilirubin Urine: NEGATIVE
Glucose, UA: NEGATIVE mg/dL
Hgb urine dipstick: NEGATIVE
Ketones, ur: NEGATIVE mg/dL
Leukocytes,Ua: NEGATIVE
Nitrite: NEGATIVE
Protein, ur: NEGATIVE mg/dL
Specific Gravity, Urine: 1.006 (ref 1.005–1.030)
pH: 6 (ref 5.0–8.0)

## 2024-02-24 MED ORDER — ESCITALOPRAM OXALATE 10 MG PO TABS
10.0000 mg | ORAL_TABLET | Freq: Every day | ORAL | 1 refills | Status: AC
  Filled 2024-02-24 – 2024-03-11 (×2): qty 90, 90d supply, fill #0

## 2024-02-24 NOTE — MAU Provider Note (Signed)
 MAU Notes     S Ms. Renee Miller is a 18 y.o. G9P0000 female at [redacted]w[redacted]d who presents to MAU today with complaint of high blood pressures measured at her psychiatry appointment. She notes that she drank coffee right before this appointment. The initial reading was 150/94 pulse 122 and the second reading was 161/86 pulse 118. Her current blood pressure upon arrival to the MAU was 128/80 and in room during interview is 122/70. She denies HA, CP, SOB, vision changes, dizziness, presyncope, palpitations, abdominal pain, VB, LOF, contractions, cough, or recent life stressors.   She has noticed that at previous Dr's visits before pregnancy, she would have elevated Bps at the beginning of visits that would resolve on retake. This has largely improved with new medication Lexapro  started 08/2023. She remembers that when she got her anatomy US  for this pregnancy, her initial BP was systolic 140-150 and improved to systolic 120 after repeat after US .   Receives care at [facility]. Prenatal records reviewed.  Pertinent items noted in HPI and remainder of comprehensive ROS otherwise negative.   O BP 128/80 (BP Location: Right Arm)   Pulse 92   Temp 98.4 F (36.9 C) (Oral)   Resp 19   Ht 5' 2 (1.575 m)   Wt 78 kg   LMP 09/03/2023   SpO2 99%   BMI 31.46 kg/m  Physical Exam Constitutional:      Appearance: Normal appearance.  Cardiovascular:     Rate and Rhythm: Normal rate and regular rhythm.     Pulses: Normal pulses.     Heart sounds: Normal heart sounds.  Pulmonary:     Effort: Pulmonary effort is normal.     Breath sounds: Normal breath sounds.  Musculoskeletal:     Right lower leg: No edema.     Left lower leg: No edema.  Skin:    General: Skin is warm and dry.  Neurological:     General: No focal deficit present.     Mental Status: She is alert and oriented to person, place, and time.  Psychiatric:        Mood and Affect: Mood normal.        Behavior: Behavior normal.        Thought  Content: Thought content normal.    No results found for this or any previous visit (from the past 24 hours).  MDM: Low MAU Course: BP monitored and patient was normotensive during course of MAU stay  A There are no diagnoses linked to this encounter. Medical screening exam complete  P Discharge from MAU in stable condition with precautions on when to return if BP taken at home is persistently elevated  Follow up at [facility] as scheduled for ongoing prenatal care  Future Appointments  Date Time Provider Department Center  03/10/2024  8:15 AM Synthia Raisin, CNM [facility] None  06/22/2024  2:00 PM White, Redell LABOR, NP [facility] None    Brad Prey, Medical Student

## 2024-02-24 NOTE — Progress Notes (Deleted)
 [facility] Med Check  Patient ID: Renee Miller,  MRN: [id number]  PCP: Daryl Setter, NP  Date of Evaluation: 02/24/2024 Time spent:{TIME; 0 MIN TO 60 MIN:[phone]}  Chief Complaint:   HISTORY/CURRENT STATUS: HPI  Individual Medical History/ Review of Systems: Changes? :{EXAM; YES/NO:21197}  Allergies: Amoxicillin  Current Medications:  Current Outpatient Medications:    azelaic acid (AZELEX) 20 % cream, Apply topically daily. After skin is thoroughly washed and patted dry, gently but thoroughly massage a thin film of azelaic acid cream into the affected area every other night, Disp: 30 g, Rfl: 6   Clindamycin  Phos-Benzoyl Perox (ONEXTON) 1.2-3.75 % GEL, Apply 1 Application topically at bedtime. Use Nightly, every other night, Disp: 3.5 g, Rfl: 6   escitalopram  (LEXAPRO ) 10 MG tablet, Take 1 tablet (10 mg total) by mouth daily., Disp: 30 tablet, Rfl: 3   hydrOXYzine  (VISTARIL ) 25 MG capsule, Take 1 capsule (25 mg total) by mouth every 8 (eight) hours as needed (anxiety or insomnia). (Patient not taking: Reported on 02/19/2024), Disp: 90 capsule, Rfl: 1   Prenatal Vit-Fe Fumarate-FA (MULTIVITAMIN-PRENATAL) 27-0.8 MG TABS tablet, Take 1 tablet by mouth daily at 12 noon., Disp: , Rfl:  Medication Side Effects: {Medication Side Effects (Optional):12147}  Family Medical/ Social History: Changes? {EXAM; YES/NO:19492}  MENTAL HEALTH EXAM:  Last menstrual period 09/03/2023.There is no height or weight on file to calculate BMI.  General Appearance: {PSY:[phone]}  Eye Contact:  {PSY:22684}  Speech:  {PSY:[phone]}  Volume:  {PSY:22686}  Mood:  {PSY:22306}  Affect:  {[phone]}  Thought Process:  {PSY:22688}  Orientation:  {PSY:22689}  Thought Content: {PSYt:22690}   Suicidal Thoughts:  {PSY:22692}  Homicidal Thoughts:  {PSY:22692}  Memory:  {PSY:[phone]}  Judgement:  {PSY:22694}  Insight:  {PSY:22695}  Psychomotor Activity:  {PSY:22696}   Concentration:  {PSY:21399}  Recall:  {PSY:22877}  Fund of Knowledge: {PSY:22877}  Language: {EDB:77122}  Assets:  {PSY:22698}  ADL's:  {PSY:22290}  Cognition: {PSY:[id number]}  Prognosis:  {PSY:22877}    DIAGNOSES: No diagnosis found.  Receiving Psychotherapy: {EDB:78802}   RECOMMENDATIONS: ***   Redell DELENA Pizza, NP

## 2024-02-24 NOTE — Discharge Instructions (Signed)
 You were seen in the MAU to evaluate your blood pressure. We checked your blood pressures multiple times and they were all normal. We recommend continuing to check at home about once a week. If you get numbers above 140 for the top number or 90 for the bottom number, contact your OB office.

## 2024-02-24 NOTE — Progress Notes (Signed)
 [facility]  Patient ID: Renee Miller,  MRN: [id number]  PCP: Daryl Setter, NP  Date of Evaluation: 02/24/2024 Time spent:40 minutes  Chief Complaint:  Chief Complaint   Depression; Anxiety; Follow-up; Patient Education; Medication Refill     HISTORY/CURRENT STATUS: HPI Renee Miller 38, 18 year old female presents to this office f for follow-up and medication management.  Collateral information should be considered reliable.  Patient reports that she is now [redacted] weeks pregnant.  She is following up with OB/GYN regularly.  She is here today wanting to discuss risk of continuing Lexapro  during pregnancy and breast-feeding.  States that she would like to continue the medication if possible because she worries about increase risk for postpartum.  Reports that she has had some issues with her blood pressure spiking at times.  Says that she does struggle with some continued anxiety about pregnancy and medication.  She rates her anxiety today at 5/10, and depression at 4/10.  She is sleeping 7 to 8 hours per night.  Patient states that she feels safe and verbally contracts for safety with this clinical research associate.  She is currently living with mother in [facility] .. She denies history of mania, no psychosis, no auditory or visual hallucinations.  No SI or HI.   Past psychiatric medication trials: Effexor  Sertraline  Hydroxyzine  Ativan  Prozac        Individual Medical History/ Review of Systems: Changes? :No   Allergies: Amoxicillin  Current Medications:  Current Outpatient Medications:    azelaic acid (AZELEX) 20 % cream, Apply topically daily. After skin is thoroughly washed and patted dry, gently but thoroughly massage a thin film of azelaic acid cream into the affected area every other night, Disp: 30 g, Rfl: 6   Clindamycin  Phos-Benzoyl Perox (ONEXTON) 1.2-3.75 % GEL, Apply 1 Application topically at bedtime. Use Nightly, every other night, Disp: 3.5 g, Rfl:  6   escitalopram  (LEXAPRO ) 10 MG tablet, Take 1 tablet (10 mg total) by mouth daily., Disp: 90 tablet, Rfl: 1   hydrOXYzine  (VISTARIL ) 25 MG capsule, Take 1 capsule (25 mg total) by mouth every 8 (eight) hours as needed (anxiety or insomnia). (Patient not taking: Reported on 02/19/2024), Disp: 90 capsule, Rfl: 1   Prenatal Vit-Fe Fumarate-FA (MULTIVITAMIN-PRENATAL) 27-0.8 MG TABS tablet, Take 1 tablet by mouth daily at 12 noon., Disp: , Rfl:  Medication Side Effects: none  Family Medical/ Social History: Changes? No  MENTAL HEALTH EXAM:  Blood pressure (!) 161/86, pulse (!) 122, height 5' 2 (1.575 m), weight 179 lb (81.2 kg), last menstrual period 09/03/2023.Body mass index is 32.74 kg/m.  General Appearance: Casual and Neat  Eye Contact:  Good  Speech:  Clear and Coherent  Volume:  Normal  Mood:  NA  Affect:  Appropriate  Thought Process:  Coherent  Orientation:  Full (Time, Place, and Person)  Thought Content: Logical   Suicidal Thoughts:  No  Homicidal Thoughts:  No  Memory:  WNL  Judgement:  Good  Insight:  Good  Psychomotor Activity:  Normal  Concentration:  Concentration: Good  Recall:  Good  Fund of Knowledge: Good  Language: Good  Assets:  Desire for Improvement  ADL's:  Intact  Cognition: WNL  Prognosis:  Good    DIAGNOSES:    ICD-10-CM   1. Major depressive disorder, recurrent episode, moderate (HCC)  F33.1 escitalopram  (LEXAPRO ) 10 MG tablet    2. Generalized anxiety disorder  F41.1 escitalopram  (LEXAPRO ) 10 MG tablet    3. Panic attacks  F41.0 escitalopram  (LEXAPRO )  10 MG tablet      Receiving Psychotherapy: No    RECOMMENDATIONS:   Greater than 50% of 40 min face to face time with patient was spent on counseling and coordination of care.  Patient had questions about antidepressant medication and safety during pregnancy and breast-feeding.  She is currently [redacted] weeks gestation.  I explained that even though that there was low risk that I cannot  guarantee that there was not some low risk associated with the use of antidepressants during pregnancy or breast-feeding. It is surely better to not take these medications during pregnancy however it sometimes is necessary and the benefit  may outweigh the risk especially in patients with hx of  high anxiety and depression  levels.  We discussed how many women choose to remain on their antidepressant through pregnancy with normal outcomes.  She expressed understanding and wished to remain on Lexapro  for now.  When checking BP and HR her initial readings were 150/94 with HR at 116.  I coached the patient through some deep breathing exercises and waited approximately 10 minutes to recheck.  Second reading was 161/86 with HR at 122.  I expressed concern and contacted Dr. Delon Solian, MD.  She suggested that the patient go to the MAU for further evaluation possible pre emclamp.  I successfully contacted the patient back and patient agreed that she was going to go to the MAU for further assessment.    We agreed today to: Will continue Lexapro  10 mg once daily after breakfast Stopped Ativan  0.5 mg daily as needed for severe anxiety only Patient to stop hydroxyzine  Provided emergency contact information Will follow-up in 1 month after delivery or if needed sooner agrees to call.  Patient will continue to follow-up with OB/GYN regularly and also discuss medications. Will report worsening symptoms or side effects promptly Discussed potential benefits, risk, and side effects of benzodiazepines to include potential risk of tolerance and dependence, as well as possible drowsiness.  Advised patient not to drive if experiencing drowsiness and to take lowest possible effective dose to minimize risk of dependence and tolerance.    Redell DELENA Pizza, NP

## 2024-02-24 NOTE — MAU Note (Signed)
 Renee Miller is a 18 y.o. at [redacted]w[redacted]d here in MAU reporting: she was seeing Psychiatrist this morning and BP and pulse were taken and they were high twice, BP 150/94 pulse 122 and 161/86 and pulse 118 (eight minutes later).  OB notified and pt instructed to be seen in MAU.  Denies HA, visual disturbances and epigastric pain.  Reports has been experiencing epigastric pain recently but after eating spicy foods.  Denies VB and LOF.  Reports +FM.  LMP: 09/03/2023 Onset of complaint: today Pain score: 0 Vitals:   02/24/24 1548  BP: 128/80  Pulse: 92  Resp: 19  Temp: 98.4 F (36.9 C)  SpO2: 99%     FHT: 142 bpm  Lab orders placed from triage: UA

## 2024-03-10 ENCOUNTER — Ambulatory Visit

## 2024-03-10 VITALS — BP 111/64 | HR 81 | Wt 177.0 lb

## 2024-03-10 DIAGNOSIS — Z23 Encounter for immunization: Secondary | ICD-10-CM | POA: Diagnosis not present

## 2024-03-10 DIAGNOSIS — Z3400 Encounter for supervision of normal first pregnancy, unspecified trimester: Secondary | ICD-10-CM | POA: Diagnosis not present

## 2024-03-10 DIAGNOSIS — Z6791 Unspecified blood type, Rh negative: Secondary | ICD-10-CM

## 2024-03-10 DIAGNOSIS — Z3A27 27 weeks gestation of pregnancy: Secondary | ICD-10-CM

## 2024-03-10 DIAGNOSIS — Z3492 Encounter for supervision of normal pregnancy, unspecified, second trimester: Secondary | ICD-10-CM | POA: Diagnosis not present

## 2024-03-10 DIAGNOSIS — O26899 Other specified pregnancy related conditions, unspecified trimester: Secondary | ICD-10-CM

## 2024-03-10 MED ORDER — RHO D IMMUNE GLOBULIN 1500 UNIT/2ML IJ SOSY
300.0000 ug | PREFILLED_SYRINGE | Freq: Once | INTRAMUSCULAR | Status: AC
  Administered 2024-03-10: 300 ug via INTRAMUSCULAR

## 2024-03-10 NOTE — Progress Notes (Signed)
 -Rhogam    LOW-RISK PREGNANCY OFFICE VISIT  Patient name: Renee Miller MRN [id number]  Date of birth: 04/13/2005 Chief Complaint:   Routine Prenatal Visit (27 weeks ROB)  Subjective:   Renee Miller is a 18 y.o. G60P0000 female at [redacted]w[redacted]d with an Estimated Date of Delivery: 06/09/24 being seen today for ongoing management of a low-risk pregnancy aeb has Anxiety; PTSD (post-traumatic stress disorder); Panic attacks; Major depressive disorder, recurrent episode, moderate (HCC); Generalized anxiety disorder; Galactorrhea; Tachycardia; Gastroesophageal reflux disease without esophagitis; SOB (shortness of breath); Palpitations; Supervision of normal first pregnancy, antepartum; and Rh negative state in antepartum period on their problem list.  Patient presents today, with FOB, with c/o abdominal itching.  Patient reports she started having itching one week ago despite liton usage.  She denies rash and itching in hands/feet.  She states it feels like the skin is stretching.  Patient endorses fetal movement. She  denies abdominal cramping or contractions.  Patient denies vaginal concerns including abnormal discharge, leaking of fluid, and bleeding. No issues with urination, constipation, or diarrhea.    Contractions: Not present. Vag. Bleeding: None.  Movement: Present.  Reviewed past medical,surgical, social, obstetrical and family history as well as problem list, medications and allergies.  Objective   Vitals:   03/10/24 0824  BP: 111/64  Pulse: 81  Weight: 177 lb (80.3 kg)  Body mass index is 32.37 kg/m.  Total Weight Gain:50 lb (22.7 kg)    Flowsheet Row Routine Prenatal [facility]10/[facility]  PHQ-9 Total Score 2      03/10/2024    8:26 AM 01/06/2024   11:21 AM 12/29/2021   11:06 AM 09/15/2021    8:48 AM  GAD 7 : Generalized Anxiety Score  Nervous, Anxious, on Edge 0 0    Control/stop worrying 0 0    Worry too much - different  things 0 0    Trouble relaxing 0 0    Restless 0 0    Easily annoyed or irritable 0 0    Afraid - awful might happen 0 0    Total GAD 7 Score 0 0    Anxiety Difficulty  Not difficult at all       Information is confidential and restricted. Go to Review Flowsheets to unlock data.          Physical Examination:   General appearance: Well appearing, and in no distress  Mental status: Alert, oriented to person, place, and time  Skin: Warm & dry  Cardiovascular: Normal heart rate noted  Respiratory: Normal respiratory effort, no distress  Abdomen: Soft, gravid, nontender, AGA with Fundal Height: 27 cm  Pelvic: Cervical exam deferred           Extremities: Edema: None  Fetal Status: Fetal Heart Rate (bpm): 155  Movement: Present   No results found for this or any previous visit (from the past 24 hours).   Assessment & Plan   Low-risk pregnancy of a 18 y.o., G1P0000 at [redacted]w[redacted]d with an Estimated Date of Delivery: 06/09/24   1. Supervision of normal first pregnancy, antepartum -Anticipatory guidance for upcoming appts. -Patient to schedule next appt in 2-3 weeks for an in-person visit. -Glucola appt completed today.  -Reviewed blood draw procedures and labs which also include check of iron/HgB level, RPR, and HIV *Informed that repeat RPR/HIV are for pediatric records/compliance.  -Discussed how results of GTT are handled including diabetic education and BS testing for abnormal results and  routine care for normal results.   2. [redacted] weeks gestation of pregnancy -Reviewed c/o abdominal itching. -Reassured that this can be normal especially in abdomen as this is place of frequent stretch marks.  -Encouraged moisturizing area with emollient like cocoa/shea butter f/b Vaseline or Aquaphor.    3. Rh negative, antepartum -Rhogam given today.  4. Need for Tdap vaccination -Tdap given today.       Meds:  Meds ordered this encounter  Medications   rho (d) immune globulin  (RHIG/RHOPHYLAC) injection 300 mcg   Labs/procedures today: Lab Orders         Glucose Tolerance, 2 Hours w/1 Hour         CBC         RPR W/RFLX TO RPR TITER, TREPONEMAL AB, SCREEN AND DIAGNOSIS         HIV Antibody (routine testing w rflx)      Reviewed: Preterm labor symptoms and general obstetric precautions including but not limited to vaginal bleeding, contractions, leaking of fluid and fetal movement were reviewed in detail with the patient.  All questions were answered.  Follow-up: No follow-ups on file.  Orders Placed This Encounter  Procedures   Tdap vaccine greater than or equal to 7yo IM   Glucose Tolerance, 2 Hours w/1 Hour   CBC   RPR W/RFLX TO RPR TITER, TREPONEMAL AB, SCREEN AND DIAGNOSIS   HIV Antibody (routine testing w rflx)   Harlene LITTIE Duncans MSN, CNM 03/10/2024

## 2024-03-10 NOTE — Progress Notes (Signed)
 Gad-7 and psq9 done

## 2024-03-10 NOTE — Patient Instructions (Signed)
 We highly recommend childbirth education to help you plan for labor and begin practicing coping skills (which will be needed with or without pain meds).  [place] Childbirth Education Options: Sign up by visiting [email or web address]  Childbirth ~ Self-Paced eClass (English and Spanish) This online class offers you the freedom to complete a childbirth education series in the comfort of your own home at your own pace.  Childbirth Class (In-Person 4-Week Series  or on Saturdays, Virtual 4-Week Series ~ [place]) This interactive in-person class series will help you and your partner prepare for your birth experience. Topics include: Labor & Birth, Comfort Measures, Breathing Techniques, Massage, Medical Interventions, Pain Management Options, Cesarean Birth, Postpartum Care, and Newborn Care  Comfort Techniques for Labor ~ In-Person Class [facility]) This interactive class is designed for parents-to-be who want to learn & practice hands-on skills to help relieve some of the discomfort of labor and encourage their babies to rotate toward the best position for birth. Moms and their partners will be able to try a variety of labor positions with birth balls and rebozos as well as practice breathing, relaxation, and visualization techniques.  Natural Childbirth Class (In-Person 5-Week Series, In-Person on Saturdays or Virtual 5-Week Series ~ [place]) This class series is designed for expectant parents who want to learn and practice natural methods of coping with the process of labor and childbirth.  Cesarean Birth Self-Paced eClass (English and Spanish) This online course provides comprehensive information you can trust as you prepare for a possible cesarean birth. In this class, you'll learn how to make your birth and recovery comfortable and joyful through instructive video clips, animations, and activities.  Waterbirth ~ Airline pilot Interested in a waterbirth? In addition to a consultation  with your credentialed waterbirth provider, this free, informational online class will help you discover whether waterbirth is the right fit for you. Not all obstetrical practices offer waterbirth, so check with your healthcare provider.  Tour Probation officer) - [facility] [employer] our 4 minute video tour of [facility] located in [place].   [place] Parenting Education Options:  Pregnancy 101 (Virtual) Congratulations on your pregnancy! This class is geared toward moms in their first trimester, but everyone is welcome. We are excited to guide you through all aspects of supporting a healthy pregnancy. You will learn what to expect at routine prenatal care appointments, common postpartum adjustments, basic infant safety, and breastfeeding.  Successful Partnering & Parenting ~ In-Person Workshop [facility]) This workshop inspires and equips partners of all economic levels, ages, and cultures to confidently care for their infants, support the birthing persons, and navigate their own transformations into new partners and parents. Learning activities are geared towards supporting partner, but moms are welcome to attend.  'Baby & Me' Parenting Group (Virtual on Wednesdays at 11am) Enjoy this time discussing newborn & infant parenting topics and family adjustment issues with other new parents in a relaxed environment. Each week brings a new speaker or baby-centered activity. This group offers support and connection to parents as they journey through the adjustments and struggles of that sometimes overwhelming first year after the birth of a child.  Baby Safety, CPR, & Choking Class ~ Virtual This life-saving information is meant to encourage parents as they learn important safety and prevention tips as well as infant CPR and relief of choking.  Breastfeeding Class (In-Person in [place] or [employer]) Families learn what to expect in the  first days and weeks of breastfeeding your  newborn. IF YOU ARE AN EMPLOYEE TAKING THIS CLASS FOR CREDIT, DO NOT register yourself. Please e-mail [email or web address].   Breastfeeding Self-Paced eClass (English & Spanish) Families learn what to expect in the first days and weeks of breastfeeding your newborn.  Caring for Baby ~ In-Person, Virtual or Self-Paced Class This in-person class is for both expectant and adoptive parents who want to learn and practice the most up-to-date newborn care for their babies. Focus is on birth through the first six weeks of life.  CPR & Choking Relief for Infants & Children ~ In-Person Class [facility]) This in-person course is designed for any parent, expectant parent, or adult who cares for infants or children. Participants learn and demonstrate cardiopulmonary resuscitation and choking relief procedures for both infants and children.  Grandparent Love ~ In-Person Class Grandparents will learn the most updated infant care and safety recommendations. They will discover ways to support their own children during the transition into the parenting role and receive tips on communicating with the new parents.  [place] Parenting Support Group Options:  Bereavement Grief Support Group (Pregnancy/Infant Loss) - Virtual This is an ongoing experience that meets once a month and is designed to help you honor the past, assist you in discovering tools to strengthen you today, and aid you in developing hope for the future.  Breastfeeding & Pumping Support Group (In-Person on Thursdays at 12pm or Virtual on Tuesdays at 5pm) Join [place] in-person each Thursday starting June 1st, 2023 at 12pm! This support group is free for all families looking for breastfeeding and/or pumping support.   Community-Based Childbirth Education Options:  [facility] Department Classes:  Childbirth education classes can help you get ready for a positive parenting experience. You  can also meet other expectant parents and get free stuff for your baby. Each class runs for five weeks on the same night and costs $45 for the mother-to-be and her support person. Medicaid covers the cost if you are eligible. Call [phone] to register.  [facility] offers a variety of programs for the [employer] and is another great way to get connected. Please go to [email or web address] for more information.  Childbirth With A Twist! Be informed of your options, get educated on birth, understand what your body is doing, learn how to cope, and have a lot of fun and laughs all while doing it either from the comfort of your couch OR in our cozy office and classroom space near the [place] airport. If you are taking a virtual class, then class is taught LIVE, so you can ask questions and receive answers in real-time from an experienced doula and childbirth educator.  This virtual childbirth education class will meet for five instruction times online.  Although we are based in [place], [place], this virtual class is open to anyone in the world. Please visit: [email or web address] for more information.  Books We Love: The Doula Guide to Childbirth by Harland German and Otila Back The First-Time Parent's Childbirth Handbook by Dr. Amie Critchley, CNM The Birth Partner by Truddie Crumble

## 2024-03-11 LAB — GLUCOSE TOLERANCE, 2 HOURS W/ 1HR
Glucose, 1 hour: 150 mg/dL (ref 70–179)
Glucose, 2 hour: 96 mg/dL (ref 70–152)
Glucose, Fasting: 75 mg/dL (ref 70–91)

## 2024-03-11 LAB — SYPHILIS: RPR W/REFLEX TO RPR TITER AND TREPONEMAL ANTIBODIES, TRADITIONAL SCREENING AND DIAGNOSIS ALGORITHM: RPR Ser Ql: NONREACTIVE

## 2024-03-11 LAB — CBC
Hematocrit: 34.1 % (ref 34.0–46.6)
Hemoglobin: 11.1 g/dL (ref 11.1–15.9)
MCH: 31.3 pg (ref 26.6–33.0)
MCHC: 32.6 g/dL (ref 31.5–35.7)
MCV: 96 fL (ref 79–97)
Platelets: 187 x10E3/uL (ref 150–450)
RBC: 3.55 x10E6/uL — ABNORMAL LOW (ref 3.77–5.28)
RDW: 13.2 % (ref 11.7–15.4)
WBC: 9.2 x10E3/uL (ref 3.4–10.8)

## 2024-03-11 LAB — HIV ANTIBODY (ROUTINE TESTING W REFLEX): HIV Screen 4th Generation wRfx: NONREACTIVE

## 2024-03-12 ENCOUNTER — Other Ambulatory Visit (HOSPITAL_BASED_OUTPATIENT_CLINIC_OR_DEPARTMENT_OTHER): Payer: Self-pay

## 2024-03-26 ENCOUNTER — Other Ambulatory Visit: Payer: Self-pay

## 2024-03-26 ENCOUNTER — Encounter (HOSPITAL_COMMUNITY): Payer: Self-pay | Admitting: Obstetrics and Gynecology

## 2024-03-26 ENCOUNTER — Ambulatory Visit: Admitting: Obstetrics and Gynecology

## 2024-03-26 ENCOUNTER — Inpatient Hospital Stay (HOSPITAL_COMMUNITY)
Admission: AD | Admit: 2024-03-26 | Discharge: 2024-03-26 | Disposition: A | Discharge status: Home / Self Care | Attending: Obstetrics and Gynecology | Admitting: Obstetrics and Gynecology

## 2024-03-26 VITALS — BP 115/66 | HR 92 | Wt 181.0 lb

## 2024-03-26 DIAGNOSIS — O288 Other abnormal findings on antenatal screening of mother: Secondary | ICD-10-CM | POA: Diagnosis not present

## 2024-03-26 DIAGNOSIS — O26899 Other specified pregnancy related conditions, unspecified trimester: Secondary | ICD-10-CM | POA: Diagnosis not present

## 2024-03-26 DIAGNOSIS — O368330 Maternal care for abnormalities of the fetal heart rate or rhythm, third trimester, not applicable or unspecified: Secondary | ICD-10-CM | POA: Insufficient documentation

## 2024-03-26 DIAGNOSIS — Z3A29 29 weeks gestation of pregnancy: Secondary | ICD-10-CM

## 2024-03-26 DIAGNOSIS — O368390 Maternal care for abnormalities of the fetal heart rate or rhythm, unspecified trimester, not applicable or unspecified: Secondary | ICD-10-CM | POA: Diagnosis not present

## 2024-03-26 DIAGNOSIS — Z6791 Unspecified blood type, Rh negative: Secondary | ICD-10-CM | POA: Diagnosis not present

## 2024-03-26 DIAGNOSIS — Z3400 Encounter for supervision of normal first pregnancy, unspecified trimester: Secondary | ICD-10-CM

## 2024-03-26 NOTE — MAU Note (Signed)
.  Renee Miller is a 18 y.o. at [redacted]w[redacted]d here in MAU reporting: In office today was sent further fetal monitoring. Denies vaginal bleeding or LOF.  Reports +FM   Onset of complaint: TODAY  Pain score: denies  There were no vitals filed for this visit.    Lab orders placed from triage:   none

## 2024-03-26 NOTE — Progress Notes (Signed)
 PRENATAL VISIT NOTE  Subjective:  Renee Miller is a 18 y.o. G1P0000 at [redacted]w[redacted]d being seen today for ongoing prenatal care.  She is currently monitored for the following issues for this low-risk pregnancy and has Anxiety; PTSD (post-traumatic stress disorder); Panic attacks; Major depressive disorder, recurrent episode, moderate (HCC); Generalized anxiety disorder; Galactorrhea; Tachycardia; Gastroesophageal reflux disease without esophagitis; SOB (shortness of breath); Palpitations; Supervision of normal first pregnancy, antepartum; and Rh negative state in antepartum period on their problem list.  Patient reports no complaints.  Contractions: Not present. Vag. Bleeding: None.  Movement: Present. Denies leaking of fluid.   The following portions of the patient's history were reviewed and updated as appropriate: allergies, current medications, past family history, past medical history, past social history, past surgical history and problem list.   Objective:   Vitals:   03/26/24 1022  BP: 115/66  Pulse: 92  Weight: 181 lb (82.1 kg)    Fetal Status:  Fetal Heart Rate (bpm): 165   Movement: Present    General: Alert, oriented and cooperative. Patient is in no acute distress.  Skin: Skin is warm and dry. No rash noted.   Cardiovascular: Normal heart rate noted  Respiratory: Normal respiratory effort, no problems with respiration noted  Abdomen: Soft, gravid, appropriate for gestational age.  Pain/Pressure: Absent     Pelvic: Cervical exam deferred        Extremities: Normal range of motion.  Edema: None  Mental Status: Normal mood and affect. Normal behavior. Normal judgment and thought content.      03/10/2024    8:26 AM 01/06/2024   11:20 AM 10/25/2023    2:56 PM  Depression screen PHQ 2/9  Decreased Interest 0 0   Down, Depressed, Hopeless 0 0   PHQ - 2 Score 0 0   Altered sleeping 1 1   Tired, decreased energy 1 1   Change in appetite 0 0   Feeling bad or failure about  yourself  0 0   Trouble concentrating 0 0   Moving slowly or fidgety/restless 0 0   Suicidal thoughts 0 0   PHQ-9 Score 2 2    Difficult doing work/chores  Not difficult at all      Information is confidential and restricted. Go to Review Flowsheets to unlock data.   Data saved with a previous flowsheet row definition        03/10/2024    8:26 AM 01/06/2024   11:21 AM 12/29/2021   11:06 AM 09/15/2021    8:48 AM  GAD 7 : Generalized Anxiety Score  Nervous, Anxious, on Edge 0 0    Control/stop worrying 0 0    Worry too much - different things 0 0    Trouble relaxing 0 0    Restless 0 0    Easily annoyed or irritable 0 0    Afraid - awful might happen 0 0    Total GAD 7 Score 0 0    Anxiety Difficulty  Not difficult at all       Information is confidential and restricted. Go to Review Flowsheets to unlock data.    Assessment and Plan:  Pregnancy: G1P0000 at [redacted]w[redacted]d 1. Supervision of normal first pregnancy, antepartum (Primary) Anticipatory guidance S/p tdap/flu/rhogam Fetal kick counts reviewed  2. Fetal tachycardia affecting management of mother Add on for NST due to FHR 165-170s in clinic Fetal tachycardia persistent then baseline changed to 140-150s but NST nonreactive. To MAU for further evaluation/monitoring/BPP  3. Rh  negative state in antepartum period S/p rhogam  4. [redacted] weeks gestation of pregnancy    Preterm labor symptoms and general obstetric precautions including but not limited to vaginal bleeding, contractions, leaking of fluid and fetal movement were reviewed in detail with the patient. Please refer to After Visit Summary for other counseling recommendations.   No follow-ups on file.  Future Appointments  Date Time Provider Department Center  06/22/2024  2:00 PM Teresa Redell LABOR, NP CP-CP None    [name], MD

## 2024-03-26 NOTE — MAU Provider Note (Signed)
 History     [id number]  Arrival date and time: 03/26/24 1340    Chief Complaint  Patient presents with   Follow-up     HPI Renee Miller is a 18 y.o. at [redacted]w[redacted]d by LMP, who presents after non-reactive NST at the office. She admits that she did not eat anything prior to presenting to her OB appointment. She was instructed to present to the MAU. She went home and ate breakfast prior to coming to the MAU. She has no complaints. Endorses good FM. Denies contractions, LOF, VB.     Reactive NST after non-reactive in office. No symptoms.    A/Negative/-- (08/27 1614)  Past Medical History:  Diagnosis Date   Abnormal breast tissue 09/07/2020   Acne 08/14/2022   ADHD    s/p evaluation with Dr. Garnette Altabet 11/21   Depression    H/O seasonal allergies    Irregular menstrual bleeding 08/16/2021   Weight loss 04/15/2023    Past Surgical History:  Procedure Laterality Date   ADENOIDECTOMY, TONSILLECTOMY AND MYRINGOTOMY WITH TUBE PLACEMENT Bilateral 2010   EUSTACHIAN TUBE DILATION Left 2017    Family History  Problem Relation Age of Onset   OCD Mother    Hypertension Mother    Healthy Father    Drug abuse Maternal Grandfather    Hypertension Maternal Grandfather    Hyperlipidemia Maternal Grandfather    Heart disease Maternal Grandfather    Alcohol abuse Maternal Grandfather    Bipolar disorder Maternal Grandmother    Depression Maternal Grandmother    Drug abuse Maternal Grandmother    Alcohol abuse Maternal Grandmother    Diabetes Paternal Grandmother     Social History   Socioeconomic History   Marital status: Single    Spouse name: Not on file   Number of children: Not on file   Years of education: Not on file   Highest education level: 12th grade  Occupational History   Occupation: consulting civil engineer   Occupation: Geophysicist/field seismologist  Tobacco Use   Smoking status: Never   Smokeless tobacco: Never  Vaping Use   Vaping status: Never Used  Substance and Sexual  Activity   Alcohol use: Not Currently   Drug use: Never   Sexual activity: Yes    Comment: last SI: 10/1  Other Topics Concern   Not on file  Social History Narrative   Lives with her mom. No significant relationship with biological father. Lives in [place].   Now reporting [redacted] weeks pregnant. Trying to make decision on whether she would like to continue pregnancy.   Says she will make decision by 10  weeks.         Grandparents in in [place]   Has extended family in [place]      Social Drivers of Health   Tobacco Use: Low Risk (03/26/2024)   Patient History    Smoking Tobacco Use: Never    Smokeless Tobacco Use: Never    Passive Exposure: Not on file  Financial Resource Strain: Low Risk (07/17/2023)   Overall Financial Resource Strain (CARDIA)    Difficulty of Paying Living Expenses: Not very hard  Food Insecurity: No Food Insecurity (07/17/2023)   Hunger Vital Sign    Worried About Running Out of Food in the Last Year: Never true    Ran Out of Food in the Last Year: Never true  Transportation Needs: No Transportation Needs (07/17/2023)   PRAPARE - Administrator, Civil Service (Medical): No  Lack of Transportation (Non-Medical): No  Physical Activity: Insufficiently Active (07/17/2023)   Exercise Vital Sign    Days of Exercise per Week: 3 days    Minutes of Exercise per Session: 40 min  Stress: Stress Concern Present (07/17/2023)   [employer] of Occupational Health - Occupational Stress Questionnaire    Feeling of Stress : To some extent  Social Connections: Unknown (07/17/2023)   Social Connection and Isolation Panel    Frequency of Communication with Friends and Family: More than three times a week    Frequency of Social Gatherings with Friends and Family: More than three times a week    Attends Religious Services: Never    Database Administrator or Organizations: No    Attends Engineer, Structural: Not on file    Marital Status: Patient declined   Intimate Partner Violence: Not on file  Depression (PHQ2-9): Low Risk (03/10/2024)   Depression (PHQ2-9)    PHQ-2 Score: 2  Alcohol Screen: Not on file  Housing: Low Risk (07/17/2023)   Housing Stability Vital Sign    Unable to Pay for Housing in the Last Year: No    Number of Times Moved in the Last Year: 0    Homeless in the Last Year: No  Utilities: Not on file  Health Literacy: Not on file    Allergies[1]  Medications Ordered Prior to Encounter[2]  Pertinent positives and negative per HPI, all others reviewed and negative  Physical Exam   BP 131/77 (BP Location: Right Arm)   Pulse (!) 109   Temp 97.7 F (36.5 C) (Oral)   Resp 16   Wt 83 kg   LMP 09/03/2023   SpO2 100%   BMI 33.47 kg/m   Patient Vitals for the past 24 hrs:  BP Temp Temp src Pulse Resp SpO2 Weight  03/26/24 1350 131/77 97.7 F (36.5 C) Oral (!) 109 16 100 % 83 kg    Physical Exam Vitals and nursing note reviewed.  Constitutional:      Appearance: She is well-developed.  HENT:     Head: Normocephalic and atraumatic.     Mouth/Throat:     Mouth: Mucous membranes are moist.  Eyes:     Extraocular Movements: Extraocular movements intact.  Cardiovascular:     Rate and Rhythm: Normal rate and regular rhythm.  Pulmonary:     Effort: Pulmonary effort is normal.  Abdominal:     Palpations: Abdomen is soft.     Tenderness: There is no abdominal tenderness.  Skin:    Capillary Refill: Capillary refill takes less than 2 seconds.  Neurological:     General: No focal deficit present.     Mental Status: She is alert.      FHT Baseline: 150 bpm Variability: Good {> 6 bpm) Accelerations: Reactive with multiple 10x10 and 15x15 accels Decelerations: Absent Uterine activity: None  Labs No results found for this or any previous visit (from the past 24 hours).  Imaging No results found.  MAU Course  Procedures Lab Orders  No laboratory test(s) ordered today   No orders of the defined types  were placed in this encounter.  Imaging Orders  No imaging studies ordered today    MDM Moderate (Level 3-4)  Assessment and Plan  Non-reactive NST (non-stress test)  [redacted] weeks gestation of pregnancy   #FWB: NST: Reactive  Renee Miller is a 18 y.o. at [redacted]w[redacted]d by LMP, who presents after non-reactive NST at the office.   -No  patient concerns at this time -NST reactive as above  -Stable to discharge home -All questions answered, anticipatory guidance and detailed return precautions provided.      Dannelle Rhymes L Taos Tapp, MD/MHA 03/26/2024 3:08 PM  Allergies as of 03/26/2024       Reactions   Amoxicillin Hives        Medication List     STOP taking these medications    hydrOXYzine  25 MG capsule Commonly known as: VISTARIL        TAKE these medications    Azelex  20 % cream Generic drug: azelaic acid  Apply topically daily. After skin is thoroughly washed and patted dry, gently but thoroughly massage a thin film of azelaic acid  cream into the affected area every other night   Clindamycin  Phos-Benzoyl Perox 1.2-3.75 % Gel Commonly known as: Onexton Apply 1 Application topically at bedtime. Use Nightly, every other night   escitalopram  10 MG tablet Commonly known as: Lexapro  Take 1 tablet (10 mg total) by mouth daily.   multivitamin-prenatal 27-0.8 MG Tabs tablet Take 1 tablet by mouth daily at 12 noon.           [1]  Allergies Allergen Reactions   Amoxicillin Hives  [2]  No current facility-administered medications on file prior to encounter.   Current Outpatient Medications on File Prior to Encounter  Medication Sig Dispense Refill   escitalopram  (LEXAPRO ) 10 MG tablet Take 1 tablet (10 mg total) by mouth daily. 90 tablet 1   Prenatal Vit-Fe Fumarate-FA (MULTIVITAMIN-PRENATAL) 27-0.8 MG TABS tablet Take 1 tablet by mouth daily at 12 noon.     azelaic acid  (AZELEX ) 20 % cream Apply topically daily. After skin is thoroughly washed and patted dry,  gently but thoroughly massage a thin film of azelaic acid  cream into the affected area every other night 30 g 6   Clindamycin  Phos-Benzoyl Perox (ONEXTON) 1.2-3.75 % GEL Apply 1 Application topically at bedtime. Use Nightly, every other night 3.5 g 6   hydrOXYzine  (VISTARIL ) 25 MG capsule Take 1 capsule (25 mg total) by mouth every 8 (eight) hours as needed (anxiety or insomnia). (Patient not taking: Reported on 03/26/2024) 90 capsule 1

## 2024-04-03 ENCOUNTER — Encounter: Payer: Self-pay | Admitting: Obstetrics and Gynecology

## 2024-04-08 ENCOUNTER — Other Ambulatory Visit

## 2024-04-08 DIAGNOSIS — Z3400 Encounter for supervision of normal first pregnancy, unspecified trimester: Secondary | ICD-10-CM

## 2024-04-10 ENCOUNTER — Ambulatory Visit: Payer: Self-pay | Admitting: Family Medicine

## 2024-04-10 LAB — COMPREHENSIVE METABOLIC PANEL WITH GFR
ALT: 7 IU/L (ref 0–32)
AST: 12 IU/L (ref 0–40)
Albumin: 3.6 g/dL — ABNORMAL LOW (ref 4.0–5.0)
Alkaline Phosphatase: 106 IU/L (ref 42–106)
BUN/Creatinine Ratio: 11 (ref 9–23)
BUN: 5 mg/dL — ABNORMAL LOW (ref 6–20)
Bilirubin Total: 0.2 mg/dL (ref 0.0–1.2)
CO2: 18 mmol/L — ABNORMAL LOW (ref 20–29)
Calcium: 9.1 mg/dL (ref 8.7–10.2)
Chloride: 104 mmol/L (ref 96–106)
Creatinine, Ser: 0.45 mg/dL — ABNORMAL LOW (ref 0.57–1.00)
Globulin, Total: 2.3 g/dL (ref 1.5–4.5)
Glucose: 78 mg/dL (ref 70–99)
Potassium: 4.2 mmol/L (ref 3.5–5.2)
Sodium: 136 mmol/L (ref 134–144)
Total Protein: 5.9 g/dL — ABNORMAL LOW (ref 6.0–8.5)
eGFR: 141 mL/min/1.73

## 2024-04-10 LAB — BILE ACIDS, TOTAL: Bile Acids Total: 3.8 umol/L (ref 0.0–10.0)

## 2024-04-14 ENCOUNTER — Other Ambulatory Visit (HOSPITAL_BASED_OUTPATIENT_CLINIC_OR_DEPARTMENT_OTHER): Payer: Self-pay

## 2024-04-14 ENCOUNTER — Ambulatory Visit: Admitting: Family

## 2024-04-14 VITALS — BP 124/68 | HR 104 | Temp 98.2°F | Resp 16 | Ht 62.0 in | Wt 187.0 lb

## 2024-04-14 DIAGNOSIS — J020 Streptococcal pharyngitis: Secondary | ICD-10-CM | POA: Diagnosis not present

## 2024-04-14 DIAGNOSIS — R011 Cardiac murmur, unspecified: Secondary | ICD-10-CM

## 2024-04-14 DIAGNOSIS — J029 Acute pharyngitis, unspecified: Secondary | ICD-10-CM | POA: Diagnosis not present

## 2024-04-14 LAB — POC INFLUENZA A&B (BINAX/QUICKVUE)
Influenza A, POC: NEGATIVE
Influenza B, POC: NEGATIVE

## 2024-04-14 LAB — POCT RAPID STREP A (OFFICE): Rapid Strep A Screen: POSITIVE — AB

## 2024-04-14 MED ORDER — CEPHALEXIN 500 MG PO CAPS
500.0000 mg | ORAL_CAPSULE | Freq: Two times a day (BID) | ORAL | 0 refills | Status: AC
  Filled 2024-04-14: qty 20, 10d supply, fill #0

## 2024-04-14 NOTE — Patient Instructions (Signed)
" °  VISIT SUMMARY: Today, you were seen for a sore throat and sinus pressure that have been bothering you for the past three days. You also have a history of shortness of breath, which has improved since the third trimester of your pregnancy. We discussed your symptoms, reviewed your medical history, and made a plan to address your current health issues.  YOUR PLAN: -STREPTOCOCCAL PHARYNGITIS: Streptococcal pharyngitis is a bacterial infection that causes a sore throat. We suspect you have this infection, and a strep test is pending. Since you are allergic to amoxicillin, we have prescribed a cephalosporin antibiotic, which is safe and effective during pregnancy. Please discard your toothbrush in 24 hours and report any signs of an allergic reaction to the antibiotic.  -CARDIAC MURMUR COMPLICATING PREGNANCY: A cardiac murmur is an unusual sound heard during a heartbeat, which can sometimes occur during pregnancy. We noted your shortness of breath and have ordered an echocardiogram to assess your heart function. Please schedule this test at the imaging department.  -PREGNANCY, THIRD TRIMESTER, ROUTINE SUPERVISION: You are in the third trimester of your pregnancy, with a due date of March 3rd. Continue with your routine prenatal care to ensure both your health and the baby's health.  INSTRUCTIONS: Please follow up with the imaging department to schedule your echocardiogram. Continue with your routine prenatal care and report any signs of an allergic reaction to the prescribed antibiotic.                                                "

## 2024-04-14 NOTE — Assessment & Plan Note (Addendum)
 Rapid strep +, Flu A/B testing is negative. Allergy to amoxicillin noted (hives). Cephalosporin preferred due to safety and efficacy during pregnancy. - Prescribed cephalosporin antibiotic. - Advised to discard toothbrush for fresh one in 24 hours. - Instructed to report any signs of allergic reaction to cephalosporin.

## 2024-04-14 NOTE — Progress Notes (Signed)
 "  Subjective:     Patient ID: Renee Miller, female    DOB: Aug 25, 2005, 19 y.o.   MRN: [id number]  Chief Complaint  Patient presents with   Sore Throat    Patient reports having sore throat for about 3 days   Nasal Congestion    Sore Throat     Discussed the use of AI scribe software for clinical note transcription with the patient, who gave verbal consent to proceed.  History of Present Illness Renee Miller is a 19 year old female who presents with a sore throat and sinus pressure.  She has experienced a sore throat for the past three days, severe enough to disrupt her sleep. Alongside the sore throat, she has a stuffy nose and painful sinus pressure, particularly noticeable at night. She has been managing her symptoms with over-the-counter treatments, including Cepacol lozenges and nasal spray. No fever has been noted, and she has not undergone testing for flu or COVID-19.  She has a history of shortness of breath, which she attributes to anxiety. This symptom has improved since the third trimester of her pregnancy. Earlier in her pregnancy, she experienced shortness of breath.  She has a known allergy to amoxicillin, which causes hives.      Health Maintenance Due  Topic Date Due   Meningococcal B Vaccine (1 of 2 - Standard) Never done   COVID-19 Vaccine (4 - 2025-26 season) 12/09/2023    Past Medical History:  Diagnosis Date   Abnormal breast tissue 09/07/2020   Acne 08/14/2022   ADHD    s/p evaluation with Dr. Garnette Altabet 11/21   Depression    H/O seasonal allergies    Irregular menstrual bleeding 08/16/2021   Weight loss 04/15/2023    Past Surgical History:  Procedure Laterality Date   ADENOIDECTOMY, TONSILLECTOMY AND MYRINGOTOMY WITH TUBE PLACEMENT Bilateral 2010   EUSTACHIAN TUBE DILATION Left 2017    Family History  Problem Relation Age of Onset   OCD Mother    Hypertension Mother    Healthy Father    Drug abuse Maternal  Grandfather    Hypertension Maternal Grandfather    Hyperlipidemia Maternal Grandfather    Heart disease Maternal Grandfather    Alcohol abuse Maternal Grandfather    Bipolar disorder Maternal Grandmother    Depression Maternal Grandmother    Drug abuse Maternal Grandmother    Alcohol abuse Maternal Grandmother    Diabetes Paternal Grandmother     Social History   Socioeconomic History   Marital status: Single    Spouse name: Not on file   Number of children: Not on file   Years of education: Not on file   Highest education level: 12th grade  Occupational History   Occupation: consulting civil engineer   Occupation: Geophysicist/field seismologist  Tobacco Use   Smoking status: Never   Smokeless tobacco: Never  Vaping Use   Vaping status: Never Used  Substance and Sexual Activity   Alcohol use: Not Currently   Drug use: Never   Sexual activity: Yes    Comment: last SI: 10/1  Other Topics Concern   Not on file  Social History Narrative   Lives with her mom. No significant relationship with biological father. Lives in [place].   Now reporting [redacted] weeks pregnant. Trying to make decision on whether she would like to continue pregnancy.   Says she will make decision by 10  weeks.         Grandparents in in [place]  Has extended family in [place]      Social Drivers of Health   Tobacco Use: Low Risk (03/26/2024)   Patient History    Smoking Tobacco Use: Never    Smokeless Tobacco Use: Never    Passive Exposure: Not on file  Financial Resource Strain: Low Risk (07/17/2023)   Overall Financial Resource Strain (CARDIA)    Difficulty of Paying Living Expenses: Not very hard  Food Insecurity: No Food Insecurity (07/17/2023)   Hunger Vital Sign    Worried About Running Out of Food in the Last Year: Never true    Ran Out of Food in the Last Year: Never true  Transportation Needs: No Transportation Needs (07/17/2023)   PRAPARE - Administrator, Civil Service (Medical): No    Lack of Transportation  (Non-Medical): No  Physical Activity: Insufficiently Active (07/17/2023)   Exercise Vital Sign    Days of Exercise per Week: 3 days    Minutes of Exercise per Session: 40 min  Stress: Stress Concern Present (07/17/2023)   [employer] of Occupational Health - Occupational Stress Questionnaire    Feeling of Stress : To some extent  Social Connections: Unknown (07/17/2023)   Social Connection and Isolation Panel    Frequency of Communication with Friends and Family: More than three times a week    Frequency of Social Gatherings with Friends and Family: More than three times a week    Attends Religious Services: Never    Database Administrator or Organizations: No    Attends Engineer, Structural: Not on file    Marital Status: Patient declined  Intimate Partner Violence: Not on file  Depression (PHQ2-9): Low Risk (04/14/2024)   Depression (PHQ2-9)    PHQ-2 Score: 2  Alcohol Screen: Not on file  Housing: Low Risk (07/17/2023)   Housing Stability Vital Sign    Unable to Pay for Housing in the Last Year: No    Number of Times Moved in the Last Year: 0    Homeless in the Last Year: No  Utilities: Not on file  Health Literacy: Not on file    Outpatient Medications Prior to Visit  Medication Sig Dispense Refill   azelaic acid  (AZELEX ) 20 % cream Apply topically daily. After skin is thoroughly washed and patted dry, gently but thoroughly massage a thin film of azelaic acid  cream into the affected area every other night 30 g 6   Clindamycin  Phos-Benzoyl Perox (ONEXTON) 1.2-3.75 % GEL Apply 1 Application topically at bedtime. Use Nightly, every other night 3.5 g 6   escitalopram  (LEXAPRO ) 10 MG tablet Take 1 tablet (10 mg total) by mouth daily. 90 tablet 1   Prenatal Vit-Fe Fumarate-FA (MULTIVITAMIN-PRENATAL) 27-0.8 MG TABS tablet Take 1 tablet by mouth daily at 12 noon.     No facility-administered medications prior to visit.    Allergies[1]  ROS     Objective:    Physical  Exam Constitutional:      General: She is not in acute distress.    Appearance: Normal appearance. She is well-developed.  HENT:     Head: Normocephalic and atraumatic.     Right Ear: Ear canal and external ear normal. Tympanic membrane is scarred. Tympanic membrane is not injected.     Left Ear: Ear canal and external ear normal. Tympanic membrane is scarred. Tympanic membrane is not injected.     Mouth/Throat:     Pharynx: No pharyngeal swelling or posterior oropharyngeal erythema.  Eyes:  General: No scleral icterus. Neck:     Thyroid : No thyromegaly.  Cardiovascular:     Rate and Rhythm: Normal rate and regular rhythm.     Heart sounds: Murmur heard.     Systolic murmur is present with a grade of 3/6.  Pulmonary:     Effort: Pulmonary effort is normal. No respiratory distress.     Breath sounds: Normal breath sounds. No wheezing.  Musculoskeletal:     Cervical back: Neck supple.  Skin:    General: Skin is warm and dry.  Neurological:     Mental Status: She is alert and oriented to person, place, and time.  Psychiatric:        Mood and Affect: Mood normal.        Behavior: Behavior normal.        Thought Content: Thought content normal.        Judgment: Judgment normal.      BP 124/68 (BP Location: Left Arm, Patient Position: Sitting, Cuff Size: Normal)   Pulse (!) 104   Temp 98.2 F (36.8 C) (Oral)   Resp 16   Ht 5' 2 (1.575 m)   Wt 187 lb (84.8 kg)   LMP 09/03/2023   SpO2 98%   BMI 34.20 kg/m  Wt Readings from Last 3 Encounters:  04/14/24 187 lb (84.8 kg)  03/26/24 183 lb (83 kg)  03/26/24 181 lb (82.1 kg)       Assessment & Plan:   Problem List Items Addressed This Visit       Unprioritized   Strep throat   Rapid strep +, Flu A/B testing is negative. Allergy to amoxicillin noted (hives). Cephalosporin preferred due to safety and efficacy during pregnancy. - Prescribed cephalosporin antibiotic. - Advised to discard toothbrush for fresh one in 24  hours. - Instructed to report any signs of allergic reaction to cephalosporin.      Relevant Medications   cephALEXin  (KEFLEX ) 500 MG capsule   Murmur - Primary    Presence of cardiac murmur, possibly related to pregnancy. Shortness of breath noted last spring for which echo was ordered. She did not complete echo. She did not have murmur at that time. She feels SOB overall is better now than it was then- attributing it to anxiety which is now improved.  - Ordered echocardiogram to assess cardiac function. - Advised to schedule echocardiogram at imaging department today before leaving.      Relevant Orders   ECHOCARDIOGRAM COMPLETE   Other Visit Diagnoses       Sore throat       Relevant Orders   POCT rapid strep A (Completed)   POC Influenza A&B (Binax test) (Completed)      Assessment & Plan    I am having Fanny F. Teska Abhi start on cephALEXin . I am also having her maintain her multivitamin-prenatal, Clindamycin  Phos-Benzoyl Perox, Azelex , and escitalopram .  Meds ordered this encounter  Medications   cephALEXin  (KEFLEX ) 500 MG capsule    Sig: Take 1 capsule (500 mg total) by mouth 2 (two) times daily for 10 days.    Dispense:  20 capsule    Refill:  0    Supervising Provider:   DOMENICA BLACKBIRD A [[id number]]      [1]  Allergies Allergen Reactions   Amoxicillin Hives   "

## 2024-04-14 NOTE — Assessment & Plan Note (Signed)
" °  Presence of cardiac murmur, possibly related to pregnancy. Shortness of breath noted last spring for which echo was ordered. She did not complete echo. She did not have murmur at that time. She feels SOB overall is better now than it was then- attributing it to anxiety which is now improved.  - Ordered echocardiogram to assess cardiac function. - Advised to schedule echocardiogram at imaging department today before leaving. "

## 2024-04-23 ENCOUNTER — Other Ambulatory Visit (HOSPITAL_BASED_OUTPATIENT_CLINIC_OR_DEPARTMENT_OTHER): Payer: Self-pay

## 2024-04-23 ENCOUNTER — Ambulatory Visit (INDEPENDENT_AMBULATORY_CARE_PROVIDER_SITE_OTHER): Admitting: Family Medicine

## 2024-04-23 VITALS — BP 117/71 | HR 110 | Wt 189.1 lb

## 2024-04-23 DIAGNOSIS — Z6791 Unspecified blood type, Rh negative: Secondary | ICD-10-CM | POA: Diagnosis not present

## 2024-04-23 DIAGNOSIS — Z3400 Encounter for supervision of normal first pregnancy, unspecified trimester: Secondary | ICD-10-CM | POA: Diagnosis not present

## 2024-04-23 DIAGNOSIS — F419 Anxiety disorder, unspecified: Secondary | ICD-10-CM | POA: Diagnosis not present

## 2024-04-23 DIAGNOSIS — Z3A33 33 weeks gestation of pregnancy: Secondary | ICD-10-CM

## 2024-04-23 DIAGNOSIS — O26899 Other specified pregnancy related conditions, unspecified trimester: Secondary | ICD-10-CM

## 2024-04-23 MED ORDER — RSV PRE-FUSION F A&B VAC RCMB 120 MCG/0.5ML IM SOLR
0.5000 mL | Freq: Once | INTRAMUSCULAR | 0 refills | Status: AC
  Filled 2024-04-23: qty 0.5, 1d supply, fill #0

## 2024-04-23 NOTE — Progress Notes (Signed)
 "  PRENATAL VISIT NOTE  Subjective:  Renee Miller is a 19 y.o. G1P0000 at [redacted]w[redacted]d being seen today for ongoing prenatal care.  She is currently monitored for the following issues for this low-risk pregnancy and has Anxiety; PTSD (post-traumatic stress disorder); Panic attacks; Major depressive disorder, recurrent episode, moderate (HCC); Generalized anxiety disorder; Galactorrhea; Tachycardia; Gastroesophageal reflux disease without esophagitis; SOB (shortness of breath); Palpitations; Supervision of normal first pregnancy, antepartum; Rh negative state in antepartum period; Strep throat; and Murmur on their problem list.  Patient reports no complaints.  Contractions: Irritability. Vag. Bleeding: None.  Movement: Present. Denies leaking of fluid.   The following portions of the patient's history were reviewed and updated as appropriate: allergies, current medications, past family history, past medical history, past social history, past surgical history and problem list.   Objective:   Vitals:   04/23/24 1013  BP: 117/71  Pulse: (!) 110  Weight: 189 lb 1.3 oz (85.8 kg)    Fetal Status:  Fetal Heart Rate (bpm): 148   Movement: Present    General: Alert, oriented and cooperative. Patient is in no acute distress.  Skin: Skin is warm and dry. No rash noted.   Cardiovascular: Normal heart rate noted  Respiratory: Normal respiratory effort, no problems with respiration noted  Abdomen: Soft, gravid, appropriate for gestational age.  Pain/Pressure: Absent     Pelvic: Cervical exam deferred        Extremities: Normal range of motion.  Edema: None  Mental Status: Normal mood and affect. Normal behavior. Normal judgment and thought content.      04/14/2024    3:57 PM 03/10/2024    8:26 AM 01/06/2024   11:20 AM  Depression screen PHQ 2/9  Decreased Interest 0 0 0  Down, Depressed, Hopeless 0 0 0  PHQ - 2 Score 0 0 0  Altered sleeping 1 1 1   Tired, decreased energy 1 1 1   Change in  appetite 0 0 0  Feeling bad or failure about yourself  0 0 0  Trouble concentrating 0 0 0  Moving slowly or fidgety/restless 0 0 0  Suicidal thoughts 0 0 0  PHQ-9 Score 2 2 2    Difficult doing work/chores Not difficult at all  Not difficult at all     Data saved with a previous flowsheet row definition        04/14/2024    3:57 PM 03/10/2024    8:26 AM 01/06/2024   11:21 AM 12/29/2021   11:06 AM  GAD 7 : Generalized Anxiety Score  Nervous, Anxious, on Edge 0 0 0   Control/stop worrying 0 0 0   Worry too much - different things 0 0 0   Trouble relaxing 0 0 0   Restless 0 0 0   Easily annoyed or irritable 0 0 0   Afraid - awful might happen 0 0 0   Total GAD 7 Score 0 0 0   Anxiety Difficulty Not difficult at all  Not difficult at all      Information is confidential and restricted. Go to Review Flowsheets to unlock data.    Assessment and Plan:  Pregnancy: G1P0000 at [redacted]w[redacted]d 1. [redacted] weeks gestation of pregnancy (Primary)  2. Supervision of normal first pregnancy, antepartum FHT normal Still contemplating birth control. Maybe IUD?   3. Anxiety On lexapro   4. Rh negative state in antepartum period Rhogram previously given  Preterm labor symptoms and general obstetric precautions including but not limited to vaginal bleeding,  contractions, leaking of fluid and fetal movement were reviewed in detail with the patient. Please refer to After Visit Summary for other counseling recommendations.   No follow-ups on file.  Future Appointments  Date Time Provider Department Center  05/05/2024 11:30 AM [facility] 1 [facility]  05/08/2024 10:35 AM Synthia Raisin, CNM [facility] None  05/22/2024 10:35 AM Synthia Raisin, CNM [facility] None  05/29/2024 10:35 AM Abigail, Rollo DASEN, MD [facility] None  06/05/2024 10:35 AM Eveline Lynwood MATSU, MD [facility] None  06/12/2024 10:35 AM Synthia Raisin, CNM [facility] None  06/22/2024  2:00 PM Teresa Redell LABOR, NP [facility] None    Lang JINNY Peel, DO "

## 2024-05-05 ENCOUNTER — Other Ambulatory Visit (HOSPITAL_BASED_OUTPATIENT_CLINIC_OR_DEPARTMENT_OTHER): Payer: Self-pay

## 2024-05-05 ENCOUNTER — Ambulatory Visit (HOSPITAL_BASED_OUTPATIENT_CLINIC_OR_DEPARTMENT_OTHER)
Admission: RE | Admit: 2024-05-05 | Discharge: 2024-05-05 | Disposition: A | Discharge status: Home / Self Care | Source: Ambulatory Visit | Attending: Family | Admitting: Family

## 2024-05-05 DIAGNOSIS — R011 Cardiac murmur, unspecified: Secondary | ICD-10-CM | POA: Insufficient documentation

## 2024-05-05 LAB — ECHOCARDIOGRAM COMPLETE
AR max vel: 2.33 cm2
AV Area VTI: 1.99 cm2
AV Area mean vel: 2.16 cm2
AV Mean grad: 5 mmHg
AV Peak grad: 8.2 mmHg
Ao pk vel: 1.43 m/s
Area-P 1/2: 5.62 cm2
Calc EF: 56.2 %
S' Lateral: 2.5 cm
Single Plane A2C EF: 57.5 %
Single Plane A4C EF: 56.4 %

## 2024-05-08 ENCOUNTER — Ambulatory Visit

## 2024-05-08 VITALS — BP 128/73 | HR 110 | Wt 192.1 lb

## 2024-05-08 DIAGNOSIS — Z3400 Encounter for supervision of normal first pregnancy, unspecified trimester: Secondary | ICD-10-CM

## 2024-05-08 DIAGNOSIS — Z3A35 35 weeks gestation of pregnancy: Secondary | ICD-10-CM | POA: Diagnosis not present

## 2024-05-08 DIAGNOSIS — Z3403 Encounter for supervision of normal first pregnancy, third trimester: Secondary | ICD-10-CM | POA: Diagnosis not present

## 2024-05-08 NOTE — Progress Notes (Signed)
 "   LOW-RISK PREGNANCY OFFICE VISIT  Patient name: Renee Miller MRN [id number]  Date of birth: 2005/10/30 Chief Complaint:   Routine Prenatal Visit (35 weeks ROB )  Subjective:   Renee Miller is a 19 y.o. G2P0000 female at [redacted]w[redacted]d with an Estimated Date of Delivery: 06/09/24 being seen today for ongoing management of a low-risk pregnancy aeb has Anxiety; PTSD (post-traumatic stress disorder); Panic attacks; Major depressive disorder, recurrent episode, moderate (HCC); Generalized anxiety disorder; Galactorrhea; Tachycardia; Gastroesophageal reflux disease without esophagitis; SOB (shortness of breath); Palpitations; Supervision of normal first pregnancy, antepartum; Rh negative state in antepartum period; Strep throat; and Murmur on their problem list.  Patient presents today, alone, with anticipated pregnancy complaints including lightening crotch, cramping, and diarrhea.  Patient endorses fetal movement. Patient reports abdominal cramping, she denies contractions.  Patient denies vaginal concerns including abnormal discharge, leaking of fluid, and bleeding. No issues with urination or constipation.  She reports diarrhea is loose and occurs twice daily.  No changes in eating habits/pattern.      Contractions: Not present. Vag. Bleeding: None.  Movement: Present.  Reviewed past medical,surgical, social, obstetrical and family history as well as problem list, medications and allergies.  Objective   Vitals:   05/08/24 1050  BP: 128/73  Pulse: (!) 110  Weight: 192 lb 1.3 oz (87.1 kg)  Body mass index is 35.13 kg/m.  Total Weight Gain:65 lb 1.3 oz (29.5 kg)         Physical Examination:   General appearance: Well appearing, and in no distress  Mental status: Alert, oriented to person, place, and time  Skin: Warm & dry  Cardiovascular: Normal heart rate noted  Respiratory: Normal respiratory effort, no distress  Abdomen: Soft, gravid, nontender, AGA with Fundal Height: 37  cm  Pelvic: Cervical exam deferred           Extremities: Edema: None  Fetal Status: Fetal Heart Rate (bpm): 148  Movement: Present   No results found for this or any previous visit (from the past 24 hours).   PHQ 2 & 9 Depression Scale- Over the past 2 weeks, how often have you been bothered by any of the following problems? Little interest or pleasure in doing things: 0 Feeling down, depressed, or hopeless (PHQ Adolescent also includes...irritable): 0 PHQ-2 Total Score: 0 Trouble falling or staying asleep, or sleeping too much: 1 Feeling tired or having little energy: 1 Poor appetite or overeating (PHQ Adolescent also includes...weight loss): 0 Feeling bad about yourself - or that you are a failure or have let yourself or your family down: 0 Trouble concentrating on things, such as reading the newspaper or watching television (PHQ Adolescent also includes...like school work): 0 Moving or speaking so slowly that other people could have noticed. Or the opposite - being so fidgety or restless that you have been moving around a lot more than usual: 0 Thoughts that you would be better off dead, or of hurting yourself in some way: 0 PHQ-9 Total Score: 2 If you checked off any problems, how difficult have these problems made it for you to do your work, take care of things at home, or get along with other people?: Not difficult at all  Depression Treatment Depression Interventions/Treatment : PHQ2-9 Score <4 Follow-up Not Indicated        04/14/2024    3:57 PM 03/10/2024    8:26 AM 01/06/2024   11:21 AM 12/29/2021   11:06 AM  GAD 7 : Generalized Anxiety Score  Nervous, Anxious, on Edge 0  0  0    Control/stop worrying 0  0  0    Worry too much - different things 0  0  0    Trouble relaxing 0  0  0    Restless 0  0  0    Easily annoyed or irritable 0  0  0    Afraid - awful might happen 0  0  0    Total GAD 7 Score 0 0 0   Anxiety Difficulty Not difficult at all  Not difficult at all       Information is confidential and restricted. Go to Review Flowsheets to unlock data.   Data saved with a previous flowsheet row definition   Assessment & Plan   Low-risk pregnancy of a 19 y.o., G1P0000 at [redacted]w[redacted]d with an Estimated Date of Delivery: 06/09/24   1. Supervision of normal first pregnancy, antepartum -Anticipatory guidance for upcoming appts. -Patient to schedule next appt in 1 weeks for ani n-person visit. -Reviewed next appt and cultures to be collected.  -Educated on GBS bacteria including what it is, why we test, and how and when we treat if needed. -Discussed collection method.  -Informed that GC/CT will also be collected.  -Discussed option of cervical exam, at visit, if desired.    2. [redacted] weeks gestation of pregnancy -Doing well. -Informed that some softening of stools not uncommon during later gestation as body prepares for labor. -Discussed delivery and postpartum planning. -Contraception information placed in AVS.       Meds: No orders of the defined types were placed in this encounter.  Labs/procedures today:  Lab Orders  No laboratory test(s) ordered today     Reviewed: Preterm labor symptoms and general obstetric precautions including but not limited to vaginal bleeding, contractions, leaking of fluid and fetal movement were reviewed in detail with the patient.  All questions were answered.  Follow-up: No follow-ups on file.  No orders of the defined types were placed in this encounter.  Harlene LITTIE Duncans MSN, CNM 05/08/2024  "

## 2024-05-11 ENCOUNTER — Ambulatory Visit: Payer: Self-pay | Admitting: Family

## 2024-05-22 ENCOUNTER — Encounter

## 2024-05-29 ENCOUNTER — Encounter: Admitting: Obstetrics and Gynecology

## 2024-06-05 ENCOUNTER — Encounter: Admitting: Obstetrics & Gynecology

## 2024-06-12 ENCOUNTER — Encounter

## 2024-06-22 ENCOUNTER — Ambulatory Visit: Admitting: Behavioral Health
